# Patient Record
Sex: Female | Born: 1985 | Race: White | Hispanic: No | Marital: Single | Smoking: Never smoker
Health system: Southern US, Community
[De-identification: ages and names within clinical notes are randomized; demographics above are authoritative.]

## PROBLEM LIST (undated history)

## (undated) ENCOUNTER — Ambulatory Visit: Source: Home / Self Care

## (undated) DIAGNOSIS — Z982 Presence of cerebrospinal fluid drainage device: Secondary | ICD-10-CM

## (undated) HISTORY — PX: OTHER SURGICAL HISTORY: SHX169

## (undated) HISTORY — DX: Presence of cerebrospinal fluid drainage device: Z98.2

## (undated) HISTORY — PX: SHUNT REVISION VENTRICULAR-PERITONEAL: SHX6094

---

## 2007-08-31 ENCOUNTER — Emergency Department: Payer: Self-pay

## 2012-02-06 ENCOUNTER — Emergency Department (HOSPITAL_COMMUNITY)
Admission: EM | Admit: 2012-02-06 | Discharge: 2012-02-06 | Disposition: A | Discharge status: Home / Self Care | Payer: Managed Care, Other (non HMO) | Attending: Emergency Medicine | Admitting: Emergency Medicine

## 2012-02-06 ENCOUNTER — Encounter (HOSPITAL_COMMUNITY): Payer: Self-pay | Admitting: *Deleted

## 2012-02-06 ENCOUNTER — Emergency Department (HOSPITAL_COMMUNITY): Payer: Managed Care, Other (non HMO)

## 2012-02-06 DIAGNOSIS — F411 Generalized anxiety disorder: Secondary | ICD-10-CM | POA: Insufficient documentation

## 2012-02-06 DIAGNOSIS — IMO0002 Reserved for concepts with insufficient information to code with codable children: Secondary | ICD-10-CM | POA: Insufficient documentation

## 2012-02-06 DIAGNOSIS — N390 Urinary tract infection, site not specified: Secondary | ICD-10-CM

## 2012-02-06 DIAGNOSIS — F419 Anxiety disorder, unspecified: Secondary | ICD-10-CM

## 2012-02-06 DIAGNOSIS — R259 Unspecified abnormal involuntary movements: Secondary | ICD-10-CM | POA: Insufficient documentation

## 2012-02-06 DIAGNOSIS — Z79899 Other long term (current) drug therapy: Secondary | ICD-10-CM | POA: Insufficient documentation

## 2012-02-06 LAB — RAPID URINE DRUG SCREEN, HOSP PERFORMED
Amphetamines: NOT DETECTED
Cocaine: NOT DETECTED
Opiates: NOT DETECTED
Tetrahydrocannabinol: NOT DETECTED

## 2012-02-06 LAB — CBC WITH DIFFERENTIAL/PLATELET
Basophils Absolute: 0 10*3/uL (ref 0.0–0.1)
Eosinophils Relative: 1 % (ref 0–5)
Lymphocytes Relative: 36 % (ref 12–46)
MCV: 87.2 fL (ref 78.0–100.0)
Neutrophils Relative %: 56 % (ref 43–77)
Platelets: 350 10*3/uL (ref 150–400)
RDW: 12.6 % (ref 11.5–15.5)
WBC: 6.6 10*3/uL (ref 4.0–10.5)

## 2012-02-06 LAB — COMPREHENSIVE METABOLIC PANEL
ALT: 19 U/L (ref 0–35)
AST: 13 U/L (ref 0–37)
CO2: 24 mEq/L (ref 19–32)
Calcium: 9.7 mg/dL (ref 8.4–10.5)
GFR calc non Af Amer: >90 mL/min (ref >90)
Potassium: 3.8 mEq/L (ref 3.5–5.1)
Sodium: 140 mEq/L (ref 135–145)
Total Protein: 7.8 g/dL (ref 6.0–8.3)

## 2012-02-06 LAB — URINALYSIS, ROUTINE W REFLEX MICROSCOPIC
Bilirubin Urine: NEGATIVE
Specific Gravity, Urine: 1.009 (ref 1.005–1.030)
pH: 7.5 (ref 5.0–8.0)

## 2012-02-06 MED ORDER — METRONIDAZOLE 500 MG PO TABS: 500.0000 mg | ORAL_TABLET | Freq: Two times a day (BID) | ORAL | Status: DC

## 2012-02-06 MED ORDER — CEPHALEXIN 500 MG PO CAPS: 500.0000 mg | ORAL_CAPSULE | Freq: Four times a day (QID) | ORAL | Status: DC

## 2012-02-06 NOTE — ED Notes (Addendum)
Patient states she had an anxiety attack on Sunday, was seen at [facility].  Patient states she had another attack today while at work.  She states her body went numb and she started shaking.  She states she could not catch her breath.  Patient states she has numbness in her right hand and feels shaky all over.  Patient states she did not have any sx prior to event,  States she was not upset/anxious prior to event.  Patient states her sx lasts 5-10 min.  Patient denies pain.   She states she is having n/v today.  Patient does have vp shunt,  Last checked when she was 18.

## 2012-02-06 NOTE — ED Notes (Signed)
Pt and father are requesting a CT scan today.

## 2012-02-06 NOTE — ED Notes (Signed)
Pt had anxiety attack this am, second attack since last Sunday. Pt states body went numb, "unable to breathe for 5 mins", face turned red, right side of face became swollen. A&Ox4, ambulatory, nad. Pt had shunt placed in head when she was 54 mos old. Pt's father believes this may be pertinent to anxiety attacks.

## 2012-02-06 NOTE — ED Provider Notes (Signed)
History     CSN: [id number]  Arrival date & time 02/06/12  1043   First MD Initiated Contact with Patient 02/06/12 1116      Chief Complaint  Patient presents with  . Shaking  . Numbness    (Consider location/radiation/quality/duration/timing/severity/associated sxs/prior treatment) Patient is a 27 y.o. female presenting with anxiety. The history is provided by the patient (the pt states she had a shaking spell). No language interpreter was used.  Anxiety This is a recurrent problem. The current episode started less than 1 hour ago. The problem occurs constantly. The problem has been resolved. Pertinent negatives include no chest pain, no abdominal pain and no headaches. Nothing aggravates the symptoms. Nothing relieves the symptoms.    History reviewed. No pertinent past medical history.  Past Surgical History  Procedure Date  . Hydroceph   . Shunt revision ventricular-peritoneal     No family history on file.  History  Substance Use Topics  . Smoking status: Never Smoker   . Smokeless tobacco: Not on file  . Alcohol Use: Yes    OB History    Grav Para Term Preterm Abortions TAB SAB Ect Mult Living                  Review of Systems  Constitutional: Negative for fatigue.  HENT: Negative for congestion, sinus pressure and ear discharge.   Eyes: Negative for discharge.  Respiratory: Negative for cough.   Cardiovascular: Negative for chest pain.  Gastrointestinal: Negative for abdominal pain and diarrhea.  Genitourinary: Negative for frequency and hematuria.  Musculoskeletal: Negative for back pain.  Skin: Negative for rash.  Neurological: Negative for seizures and headaches.  Hematological: Negative.   Psychiatric/Behavioral: Positive for agitation. Negative for hallucinations.    Allergies  Review of patient's allergies indicates no known allergies.  Home Medications   Current Outpatient Rx  Name  Route  Sig  Dispense  Refill  . ALPRAZOLAM 0.5 MG PO  TABS   Oral   Take 0.5 mg by mouth every 8 (eight) hours.         . IBUPROFEN 200 MG PO TABS   Oral   Take 400 mg by mouth every 6 (six) hours as needed. For pain         . CEPHALEXIN 500 MG PO CAPS   Oral   Take 1 capsule (500 mg total) by mouth 4 (four) times daily.   28 capsule   0   . METRONIDAZOLE 500 MG PO TABS   Oral   Take 1 tablet (500 mg total) by mouth 2 (two) times daily. One po bid x 7 days   14 tablet   0     BP 119/101  Pulse 87  Temp 97.8 F (36.6 C) (Oral)  Resp 20  Ht 5\' 4"  (1.626 m)  Wt 160 lb (72.576 kg)  BMI 27.46 kg/m2  SpO2 99%  LMP 02/06/2012  Physical Exam  Constitutional: She is oriented to person, place, and time. She appears well-developed.  HENT:  Head: Normocephalic and atraumatic.  Eyes: Conjunctivae normal and EOM are normal. No scleral icterus.  Neck: Neck supple. No thyromegaly present.  Cardiovascular: Normal rate and regular rhythm.  Exam reveals no gallop and no friction rub.   No murmur heard. Pulmonary/Chest: No stridor. She has no wheezes. She has no rales. She exhibits no tenderness.  Abdominal: She exhibits no distension. There is no tenderness. There is no rebound.  Musculoskeletal: Normal range of motion. She exhibits  no edema.  Lymphadenopathy:    She has no cervical adenopathy.  Neurological: She is oriented to person, place, and time. Coordination normal.  Skin: No rash noted. No erythema.  Psychiatric: She has a normal mood and affect. Her behavior is normal.    ED Course  Procedures (including critical care time)  Labs Reviewed  URINALYSIS, ROUTINE W REFLEX MICROSCOPIC - Abnormal; Notable for the following:    Color, Urine RED (*)  BIOCHEMICALS MAY BE AFFECTED BY COLOR   APPearance CLOUDY (*)     Hgb urine dipstick LARGE (*)     Ketones, ur 15 (*)     Protein, ur 100 (*)     Leukocytes, UA MODERATE (*)     All other components within normal limits  URINE MICROSCOPIC-ADD ON - Abnormal; Notable for the  following:    Squamous Epithelial / LPF FEW (*)     Bacteria, UA MANY (*)     All other components within normal limits  CBC WITH DIFFERENTIAL  COMPREHENSIVE METABOLIC PANEL  PREGNANCY, URINE  URINE RAPID DRUG SCREEN (HOSP PERFORMED)  URINE CULTURE   Ct Head Wo Contrast  02/06/2012  *RADIOLOGY REPORT*  Clinical Data: Anxiety attack.  Shaking and numbness.  CT HEAD WITHOUT CONTRAST  Technique:  Contiguous axial images were obtained from the base of the skull through the vertex without contrast.  Comparison: None.  Findings: Hypodensity centrally in the pons on image 7 of series 2 is primarily attributed to streak artifact.  Cerebellum unremarkable.  The right parietal craniectomy noted with a right parietal approach ventriculoperitoneal shunt which terminates in the frontal horn the right lateral ventricle.  The occipital horn of the right lateral ventricle is dilated, with prominent loss of the right parietal white matter and thinning of the overlying cortex.  No intracranial hemorrhage, mass lesion, or acute infarction is identified.  No ventricular system dilatation noted.  Chronic ethmoid and right sphenoid sinusitis noted.  IMPRESSION:  1.  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  No obvious acute findings in this region. 2.  Chronic ethmoid and right sphenoid sinusitis.   Original Report Authenticated By: Gaylyn Rong, M.D.      1. Anxiety   2. UTI (lower urinary tract infection)       MDM          Benny Lennert, MD 02/06/12 1454

## 2012-02-07 LAB — URINE CULTURE: Colony Count: 15000

## 2016-06-27 ENCOUNTER — Encounter: Payer: Self-pay | Admitting: Advanced Practice Midwife

## 2016-07-25 ENCOUNTER — Ambulatory Visit (INDEPENDENT_AMBULATORY_CARE_PROVIDER_SITE_OTHER): Payer: No Typology Code available for payment source | Admitting: Advanced Practice Midwife

## 2016-07-25 ENCOUNTER — Encounter: Payer: Self-pay | Admitting: Advanced Practice Midwife

## 2016-07-25 VITALS — BP 120/80 | HR 76 | Ht 65.0 in | Wt 149.0 lb

## 2016-07-25 DIAGNOSIS — Z01419 Encounter for gynecological examination (general) (routine) without abnormal findings: Secondary | ICD-10-CM

## 2016-07-25 NOTE — Progress Notes (Signed)
Patient ID: Vicki LaymanCasey E Gallo, female   DOB: 12/12/1985, 31 y.o.   MRN: [id number]     Gynecology Annual Exam  PCP: Ailene RavelHamrick, Maura L, MD  Chief Complaint:  Chief Complaint  Patient presents with  . Gynecologic Exam    History of Present Illness: Patient is a 31 y.o. G0P0000 presents for annual exam. The patient has no complaints today. She has some pre-conception/fertility questions. She is not using any form of birth control. She and her fiance are okay with "if it happens it happens".   LMP: Patient's last menstrual period was 07/15/2016. Average Interval: regular, 28 days Duration of flow: 7 days Heavy Menses: no Clots: no Intermenstrual Bleeding: no Postcoital Bleeding: no Dysmenorrhea: no  The patient is sexually active. She currently uses none for contraception. She denies dyspareunia.  The patient does not perform self breast exams.  There is no notable family history of breast or ovarian cancer in her family.  The patient wears seatbelts: yes.   The patient has regular exercise: yes.  She admits to needing to improve healthy diet and h2o intake  The patient denies current symptoms of depression.    Review of Systems: Review of Systems  Constitutional: Negative.   HENT: Negative.   Eyes: Negative.   Respiratory: Negative.   Cardiovascular: Negative.   Gastrointestinal: Negative.   Genitourinary: Negative.   Musculoskeletal: Negative.   Skin: Negative.   Neurological: Negative.   Endo/Heme/Allergies: Negative.   Psychiatric/Behavioral: Negative.     Past Medical History:  Past Medical History:  Diagnosis Date  . VP (ventriculoperitoneal) shunt status     Past Surgical History:  Past Surgical History:  Procedure Laterality Date  . hydroceph    . SHUNT REVISION VENTRICULAR-PERITONEAL      Gynecologic History:  Patient's last menstrual period was 07/15/2016. Contraception: none Last Pap: Results were: no abnormalities   Obstetric History:  G0P0000  Family History:  No family history on file.  Social History:  Social History   Social History  . Marital status: Single    Spouse name: N/A  . Number of children: N/A  . Years of education: N/A   Occupational History  . Not on file.   Social History Main Topics  . Smoking status: Never Smoker  . Smokeless tobacco: Never Used  . Alcohol use Yes  . Drug use: No  . Sexual activity: Yes    Birth control/ protection: None   Other Topics Concern  . Not on file   Social History Narrative  . No narrative on file    Allergies:  No Known Allergies  Medications: Prior to Admission medications   Medication Sig Start Date End Date Taking? Authorizing Provider  ALPRAZolam Prudy Feeler(XANAX) 0.5 MG tablet Take 0.5 mg by mouth every 8 (eight) hours.    [provider]  cephALEXin (KEFLEX) 500 MG capsule Take 1 capsule (500 mg total) by mouth 4 (four) times daily. Patient not taking: Reported on 07/25/2016 02/06/12   Bethann BerkshireZammit, Joseph, MD  ibuprofen (ADVIL,MOTRIN) 200 MG tablet Take 400 mg by mouth every 6 (six) hours as needed. For pain    [provider]  metroNIDAZOLE (FLAGYL) 500 MG tablet Take 1 tablet (500 mg total) by mouth 2 (two) times daily. One po bid x 7 days Patient not taking: Reported on 07/25/2016 02/06/12   Bethann BerkshireZammit, Joseph, MD    Physical Exam Vitals: Blood pressure 120/80, pulse 76, height 5\' 5"  (1.651 m), weight 149 lb (67.6 kg), last menstrual period  07/15/2016.  General: NAD HEENT: normocephalic, anicteric Thyroid: no enlargement, no palpable nodules Pulmonary: No increased work of breathing, CTAB Cardiovascular: RRR, distal pulses 2+ Breast: Breast symmetrical, no tenderness, no palpable nodules or masses, no skin or nipple retraction present, no nipple discharge.  No axillary or supraclavicular lymphadenopathy. Abdomen: NABS, soft, non-tender, non-distended.  Umbilicus without lesions.  No hepatomegaly, splenomegaly or masses palpable. No evidence  of hernia  Genitourinary: Deferred for no PAP this year per guidelines and no gyn concerns Extremities: no edema, erythema, or tenderness Neurologic: Grossly intact Psychiatric: mood appropriate, affect full   Assessment: 31 y.o. G0P0000 Well woman exam  Plan: Problem List Items Addressed This Visit    None    Visit Diagnoses    Well woman exam with routine gynecological exam    -  Primary      1) STI screening was offered and declined  2) ASCCP guidelines and rational discussed.  Patient opts for every 3 years screening interval  3) Contraception - Patient chooses no form of birth control currently. She and her fiance are okay with pregnancy if it happens. Pre-conception counseling given.  4) Routine healthcare maintenance including cholesterol, diabetes screening discussed managed by PCP   5) Increase healthy lifestyle diet and hydration  6) Follow up 1 year for routine annual exam   [name], CNM

## 2017-05-14 ENCOUNTER — Encounter: Payer: Self-pay | Admitting: Advanced Practice Midwife

## 2017-05-14 ENCOUNTER — Ambulatory Visit (INDEPENDENT_AMBULATORY_CARE_PROVIDER_SITE_OTHER): Payer: No Typology Code available for payment source | Admitting: Advanced Practice Midwife

## 2017-05-14 VITALS — BP 130/82 | HR 93 | Ht 64.0 in | Wt 188.0 lb

## 2017-05-14 DIAGNOSIS — Z124 Encounter for screening for malignant neoplasm of cervix: Secondary | ICD-10-CM | POA: Diagnosis not present

## 2017-05-14 DIAGNOSIS — Z01419 Encounter for gynecological examination (general) (routine) without abnormal findings: Secondary | ICD-10-CM | POA: Diagnosis not present

## 2017-05-14 NOTE — Progress Notes (Addendum)
Patient ID: Vicki Mcdowell, female   DOB: 06/27/1985, 32 y.o.   MRN: [id number]      Gynecology Annual Exam   PCP: Ailene RavelHamrick, Maura L, MD  Chief Complaint:  Chief Complaint  Patient presents with  . Gynecologic Exam    History of Present Illness: Patient is a 32 y.o. G0P0000 presents for annual exam. The patient has no complaints today.   LMP: Patient's last menstrual period was 05/08/2017 (exact date). Average Interval: regular, 28 days Duration of flow: 7 days Heavy Menses: no Clots: no Intermenstrual Bleeding: no Postcoital Bleeding: no Dysmenorrhea: no  The patient is sexually active. She currently uses none for contraception. She denies dyspareunia.  The patient does perform self breast exams.  There is no notable family history of breast or ovarian cancer in her family.  The patient wears seatbelts: yes.   The patient has regular exercise: she is active at her job and walks regularly.    The patient denies current symptoms of depression.    Review of Systems: Review of Systems  Constitutional: Negative.   HENT: Negative.   Eyes: Negative.   Respiratory: Negative.   Cardiovascular: Negative.   Gastrointestinal: Negative.   Genitourinary: Negative.   Musculoskeletal: Negative.   Skin: Negative.   Neurological: Negative.   Endo/Heme/Allergies: Negative.   Psychiatric/Behavioral: Negative.     Past Medical History:  Past Medical History:  Diagnosis Date  . VP (ventriculoperitoneal) shunt status     Past Surgical History:  Past Surgical History:  Procedure Laterality Date  . hydroceph    . SHUNT REVISION VENTRICULAR-PERITONEAL      Gynecologic History:  Patient's last menstrual period was 05/08/2017 (exact date). Contraception: none Last Pap: Results were: no abnormalities   Obstetric History: G0P0000  Family History:  History reviewed. No pertinent family history.  Social History:  Social History   Socioeconomic History  . Marital status:  Single    Spouse name: Not on file  . Number of children: Not on file  . Years of education: Not on file  . Highest education level: Not on file  Occupational History  . Not on file  Social Needs  . Financial resource strain: Not on file  . Food insecurity:    Worry: Not on file    Inability: Not on file  . Transportation needs:    Medical: Not on file    Non-medical: Not on file  Tobacco Use  . Smoking status: Never Smoker  . Smokeless tobacco: Never Used  Substance and Sexual Activity  . Alcohol use: Never    Frequency: Never  . Drug use: No  . Sexual activity: Yes    Birth control/protection: None  Lifestyle  . Physical activity:    Days per week: 0 days    Minutes per session: 0 min  . Stress: Not at all  Relationships  . Social connections:    Talks on phone: Not on file    Gets together: Not on file    Attends religious service: Not on file    Active member of club or organization: Not on file    Attends meetings of clubs or organizations: Not on file    Relationship status: Not on file  . Intimate partner violence:    Fear of current or ex partner: Not on file    Emotionally abused: Not on file    Physically abused: Not on file    Forced sexual activity: Not on file  Other Topics Concern  .  Not on file  Social History Narrative  . Not on file    Allergies:  No Known Allergies  Medications: Prior to Admission medications   Not on File    Physical Exam Vitals: Blood pressure 130/82, pulse 93, height 5\' 4"  (1.626 m), weight 188 lb (85.3 kg), last menstrual period 05/08/2017.  General: NAD HEENT: normocephalic, anicteric Thyroid: no enlargement, no palpable nodules Pulmonary: No increased work of breathing, CTAB Cardiovascular: RRR, distal pulses 2+ Breast: Breast symmetrical, no tenderness, no palpable nodules or masses, no skin or nipple retraction present, no nipple discharge.  No axillary or supraclavicular lymphadenopathy. Abdomen: NABS, soft,  non-tender, non-distended.  Umbilicus without lesions.  No hepatomegaly, splenomegaly or masses palpable. No evidence of hernia  Genitourinary:  External: Normal external female genitalia.  Normal urethral meatus, normal Bartholin's and Skene's glands.    Vagina: Normal vaginal mucosa, no evidence of prolapse.    Cervix: Grossly normal in appearance, bleeding- end of period, no CMT  Uterus: deferred for no symptoms/shared decision making   Adnexa: deferred for no symptoms/shared decision making  Rectal: deferred  Lymphatic: no evidence of inguinal lymphadenopathy Extremities: no edema, erythema, or tenderness Neurologic: Grossly intact Psychiatric: mood appropriate, affect full   Assessment: 32 y.o. G0P0000 routine annual exam  Plan: Problem List Items Addressed This Visit    None    Visit Diagnoses    Well woman exam with routine gynecological exam    -  Primary   Relevant Orders   IGP, Aptima HPV   Cervical cancer screening       Relevant Orders   IGP, Aptima HPV      2) STI screening  wasoffered and declined  2)  ASCCP guidelines and rational discussed.  Patient opts for every 3-5 years screening interval  3) Contraception - the patient is currently using  none.  She is not actively trying to conceive but ok if it happens  4) Routine healthcare maintenance including cholesterol, diabetes screening discussed Declines  5) Return in 1 year (on 05/15/2018) for annual established gyn.   [name], CNM [facility], [facility] 05/14/2017, 3:58 PM

## 2017-05-14 NOTE — Patient Instructions (Signed)
American Heart Association (AHA) Exercise Recommendation  Being physically active is important to prevent heart disease and stroke, the nation's No. 1and No. 5killers. To improve overall cardiovascular health, we suggest at least 150 minutes per week of moderate exercise or 75 minutes per week of vigorous exercise (or a combination of moderate and vigorous activity). Thirty minutes a day, five times a week is an easy goal to remember. You will also experience benefits even if you divide your time into two or three segments of 10 to 15 minutes per day.  For people who would benefit from lowering their blood pressure or cholesterol, we recommend 40 minutes of aerobic exercise of moderate to vigorous intensity three to four times a week to lower the risk for heart attack and stroke.  Physical activity is anything that makes you move your body and burn calories.  This includes things like climbing stairs or playing sports. Aerobic exercises benefit your heart, and include walking, jogging, swimming or biking. Strength and stretching exercises are best for overall stamina and flexibility.  The simplest, positive change you can make to effectively improve your heart health is to start walking. It's enjoyable, free, easy, social and great exercise. A walking program is flexible and boasts high success rates because people can stick with it. It's easy for walking to become a regular and satisfying part of life.   For Overall Cardiovascular Health:  At least 30 minutes of moderate-intensity aerobic activity at least 5 days per week for a total of 150  OR   At least 25 minutes of vigorous aerobic activity at least 3 days per week for a total of 75 minutes; or a combination of moderate- and vigorous-intensity aerobic activity  AND   Moderate- to high-intensity muscle-strengthening activity at least 2 days per week for additional health benefits.  For Lowering Blood Pressure and Cholesterol  An  average 40 minutes of moderate- to vigorous-intensity aerobic activity 3 or 4 times per week  What if I can't make it to the time goal? Something is always better than nothing! And everyone has to start somewhere. Even if you've been sedentary for years, today is the day you can begin to make healthy changes in your life. If you don't think you'll make it for 30 or 40 minutes, set a reachable goal for today. You can work up toward your overall goal by increasing your time as you get stronger. Don't let all-or-nothing thinking rob you of doing what you can every day.  Source:http://www.heart.org   Mediterranean Diet A Mediterranean diet refers to food and lifestyle choices that are based on the traditions of countries located on the [employer]. This way of eating has been shown to help prevent certain conditions and improve outcomes for people who have chronic diseases, like kidney disease and heart disease. What are tips for following this plan? Lifestyle  Cook and eat meals together with your family, when possible.  Drink enough fluid to keep your urine clear or pale yellow.  Be physically active every day. This includes: ? Aerobic exercise like running or swimming. ? Leisure activities like gardening, walking, or housework.  Get 7-8 hours of sleep each night.  If recommended by your health care provider, drink red wine in moderation. This means 1 glass a day for nonpregnant women and 2 glasses a day for men. A glass of wine equals 5 oz (150 mL). Reading food labels  Check the serving size of packaged foods. For foods such as rice  and pasta, the serving size refers to the amount of cooked product, not dry.  Check the total fat in packaged foods. Avoid foods that have saturated fat or trans fats.  Check the ingredients list for added sugars, such as corn syrup. Shopping  At the grocery store, buy most of your food from the areas near the walls of the store. This  includes: ? Fresh fruits and vegetables (produce). ? Grains, beans, nuts, and seeds. Some of these may be available in unpackaged forms or large amounts (in bulk). ? Fresh seafood. ? Poultry and eggs. ? Low-fat dairy products.  Buy whole ingredients instead of prepackaged foods.  Buy fresh fruits and vegetables in-season from local farmers markets.  Buy frozen fruits and vegetables in resealable bags.  If you do not have access to quality fresh seafood, buy precooked frozen shrimp or canned fish, such as tuna, salmon, or sardines.  Buy small amounts of raw or cooked vegetables, salads, or olives from the deli or salad bar at your store.  Stock your pantry so you always have certain foods on hand, such as olive oil, canned tuna, canned tomatoes, rice, pasta, and beans. Cooking  Cook foods with extra-virgin olive oil instead of using butter or other vegetable oils.  Have meat as a side dish, and have vegetables or grains as your main dish. This means having meat in small portions or adding small amounts of meat to foods like pasta or stew.  Use beans or vegetables instead of meat in common dishes like chili or lasagna.  Experiment with different cooking methods. Try roasting or broiling vegetables instead of steaming or sauteing them.  Add frozen vegetables to soups, stews, pasta, or rice.  Add nuts or seeds for added healthy fat at each meal. You can add these to yogurt, salads, or vegetable dishes.  Marinate fish or vegetables using olive oil, lemon juice, garlic, and fresh herbs. Meal planning  Plan to eat 1 vegetarian meal one day each week. Try to work up to 2 vegetarian meals, if possible.  Eat seafood 2 or more times a week.  Have healthy snacks readily available, such as: ? Vegetable sticks with hummus. ? [place] yogurt. ? Fruit and nut trail mix.  Eat balanced meals throughout the week. This includes: ? Fruit: 2-3 servings a day ? Vegetables: 4-5 servings a  day ? Low-fat dairy: 2 servings a day ? Fish, poultry, or lean meat: 1 serving a day ? Beans and legumes: 2 or more servings a week ? Nuts and seeds: 1-2 servings a day ? Whole grains: 6-8 servings a day ? Extra-virgin olive oil: 3-4 servings a day  Limit red meat and sweets to only a few servings a month What are my food choices?  Mediterranean diet ? Recommended ? Grains: Whole-grain pasta. Brown rice. Bulgar wheat. Polenta. Couscous. Whole-wheat bread. [place]. ? Vegetables: Artichokes. Beets. Broccoli. Cabbage. Carrots. Eggplant. Green beans. Chard. Kale. Spinach. Onions. Leeks. Peas. Squash. Tomatoes. Peppers. Radishes. ? Fruits: Apples. Apricots. Avocado. Berries. Bananas. Cherries. Dates. Figs. Grapes. Lemons. Melon. Oranges. Peaches. Plums. Pomegranate. ? Meats and other protein foods: Beans. Almonds. Sunflower seeds. Pine nuts. Peanuts. [place]. Salmon. Scallops. Shrimp. [place]. Tilapia. Clams. Oysters. Eggs. ? Dairy: Low-fat milk. Cheese. Greek yogurt. ? Beverages: Water. Red wine. Herbal tea. ? Fats and oils: Extra virgin olive oil. Avocado oil. Grape seed oil. ? Sweets and desserts: [place] yogurt with honey. Baked apples. Poached pears. Trail mix. ? Seasoning and other foods: Basil. Cilantro. Coriander.  Cumin. Mint. Parsley. Sage. Rosemary. Tarragon. Garlic. Oregano. Thyme. Pepper. Balsalmic vinegar. Tahini. Hummus. Tomato sauce. Olives. Mushrooms. ? Limit these ? Grains: Prepackaged pasta or rice dishes. Prepackaged cereal with added sugar. ? Vegetables: Deep fried potatoes (french fries). ? Fruits: Fruit canned in syrup. ? Meats and other protein foods: Beef. Pork. Lamb. Poultry with skin. Hot dogs. Bacon. ? Dairy: Ice cream. Sour cream. Whole milk. ? Beverages: Juice. Sugar-sweetened soft drinks. Beer. Liquor and spirits. ? Fats and oils: Butter. Canola oil. Vegetable oil. Beef fat (tallow). Lard. ? Sweets and desserts: Cookies. Cakes. Pies. Candy. ? Seasoning and other  foods: Mayonnaise. Premade sauces and marinades. ? The items listed may not be a complete list. Talk with your dietitian about what dietary choices are right for you. Summary  The Mediterranean diet includes both food and lifestyle choices.  Eat a variety of fresh fruits and vegetables, beans, nuts, seeds, and whole grains.  Limit the amount of red meat and sweets that you eat.  Talk with your health care provider about whether it is safe for you to drink red wine in moderation. This means 1 glass a day for nonpregnant women and 2 glasses a day for men. A glass of wine equals 5 oz (150 mL). This information is not intended to replace advice given to you by your health care provider. Make sure you discuss any questions you have with your health care provider. Document Released: 09/06/2015 Document Revised: 10/09/2015 Document Reviewed: 09/06/2015 Elsevier Interactive Patient Education  2018 [employer] Health Maintenance, Female Adopting a healthy lifestyle and getting preventive care can go a long way to promote health and wellness. Talk with your health care provider about what schedule of regular examinations is right for you. This is a good chance for you to check in with your provider about disease prevention and staying healthy. In between checkups, there are plenty of things you can do on your own. Experts have done a lot of research about which lifestyle changes and preventive measures are most likely to keep you healthy. Ask your health care provider for more information. Weight and diet Eat a healthy diet  Be sure to include plenty of vegetables, fruits, low-fat dairy products, and lean protein.  Do not eat a lot of foods high in solid fats, added sugars, or salt.  Get regular exercise. This is one of the most important things you can do for your health. ? Most adults should exercise for at least 150 minutes each week. The exercise should increase your heart rate and make you  sweat (moderate-intensity exercise). ? Most adults should also do strengthening exercises at least twice a week. This is in addition to the moderate-intensity exercise.  Maintain a healthy weight  Body mass index (BMI) is a measurement that can be used to identify possible weight problems. It estimates body fat based on height and weight. Your health care provider can help determine your BMI and help you achieve or maintain a healthy weight.  For females 20 years of age and older: ? A BMI below 18.5 is considered underweight. ? A BMI of 18.5 to 24.9 is normal. ? A BMI of 25 to 29.9 is considered overweight. ? A BMI of 30 and above is considered obese.  Watch levels of cholesterol and blood lipids  You should start having your blood tested for lipids and cholesterol at 32 years of age, then have this test every 5 years.  You may need to have your cholesterol levels   checked more often if: ? Your lipid or cholesterol levels are high. ? You are older than 32 years of age. ? You are at high risk for heart disease.  Cancer screening Lung Cancer  Lung cancer screening is recommended for adults 34-81 years old who are at high risk for lung cancer because of a history of smoking.  A yearly low-dose CT scan of the lungs is recommended for people who: ? Currently smoke. ? Have quit within the past 15 years. ? Have at least a 30-pack-year history of smoking. A pack year is smoking an average of one pack of cigarettes a day for 1 year.  Yearly screening should continue until it has been 15 years since you quit.  Yearly screening should stop if you develop a health problem that would prevent you from having lung cancer treatment.  Breast Cancer  Practice breast self-awareness. This means understanding how your breasts normally appear and feel.  It also means doing regular breast self-exams. Let your health care provider know about any changes, no matter how small.  If you are in your 20s  or 30s, you should have a clinical breast exam (CBE) by a health care provider every 1-3 years as part of a regular health exam.  If you are 62 or older, have a CBE every year. Also consider having a breast X-ray (mammogram) every year.  If you have a family history of breast cancer, talk to your health care provider about genetic screening.  If you are at high risk for breast cancer, talk to your health care provider about having an MRI and a mammogram every year.  Breast cancer gene (BRCA) assessment is recommended for women who have family members with BRCA-related cancers. BRCA-related cancers include: ? Breast. ? Ovarian. ? Tubal. ? Peritoneal cancers.  Results of the assessment will determine the need for genetic counseling and BRCA1 and BRCA2 testing.  Cervical Cancer Your health care provider may recommend that you be screened regularly for cancer of the pelvic organs (ovaries, uterus, and vagina). This screening involves a pelvic examination, including checking for microscopic changes to the surface of your cervix (Pap test). You may be encouraged to have this screening done every 3 years, beginning at age 8.  For women ages 52-65, health care providers may recommend pelvic exams and Pap testing every 3 years, or they may recommend the Pap and pelvic exam, combined with testing for human papilloma virus (HPV), every 5 years. Some types of HPV increase your risk of cervical cancer. Testing for HPV may also be done on women of any age with unclear Pap test results.  Other health care providers may not recommend any screening for nonpregnant women who are considered low risk for pelvic cancer and who do not have symptoms. Ask your health care provider if a screening pelvic exam is right for you.  If you have had past treatment for cervical cancer or a condition that could lead to cancer, you need Pap tests and screening for cancer for at least 20 years after your treatment. If Pap tests  have been discontinued, your risk factors (such as having a new sexual partner) need to be reassessed to determine if screening should resume. Some women have medical problems that increase the chance of getting cervical cancer. In these cases, your health care provider may recommend more frequent screening and Pap tests.  Colorectal Cancer  This type of cancer can be detected and often prevented.  Routine colorectal cancer screening  usually begins at 32 years of age and continues through 32 years of age.  Your health care provider may recommend screening at an earlier age if you have risk factors for colon cancer.  Your health care provider may also recommend using home test kits to check for hidden blood in the stool.  A small camera at the end of a tube can be used to examine your colon directly (sigmoidoscopy or colonoscopy). This is done to check for the earliest forms of colorectal cancer.  Routine screening usually begins at age 8.  Direct examination of the colon should be repeated every 5-10 years through 32 years of age. However, you may need to be screened more often if early forms of precancerous polyps or small growths are found.  Skin Cancer  Check your skin from head to toe regularly.  Tell your health care provider about any new moles or changes in moles, especially if there is a change in a mole's shape or color.  Also tell your health care provider if you have a mole that is larger than the size of a pencil eraser.  Always use sunscreen. Apply sunscreen liberally and repeatedly throughout the day.  Protect yourself by wearing long sleeves, pants, a wide-brimmed hat, and sunglasses whenever you are outside.  Heart disease, diabetes, and high blood pressure  High blood pressure causes heart disease and increases the risk of stroke. High blood pressure is more likely to develop in: ? People who have blood pressure in the high end of the normal range (130-139/85-89 mm  Hg). ? People who are overweight or obese. ? People who are African American.  If you are 29-23 years of age, have your blood pressure checked every 3-5 years. If you are 68 years of age or older, have your blood pressure checked every year. You should have your blood pressure measured twice-once when you are at a hospital or clinic, and once when you are not at a hospital or clinic. Record the average of the two measurements. To check your blood pressure when you are not at a hospital or clinic, you can use: ? An automated blood pressure machine at a pharmacy. ? A home blood pressure monitor.  If you are between 82 years and 71 years old, ask your health care provider if you should take aspirin to prevent strokes.  Have regular diabetes screenings. This involves taking a blood sample to check your fasting blood sugar level. ? If you are at a normal weight and have a low risk for diabetes, have this test once every three years after 32 years of age. ? If you are overweight and have a high risk for diabetes, consider being tested at a younger age or more often. Preventing infection Hepatitis B  If you have a higher risk for hepatitis B, you should be screened for this virus. You are considered at high risk for hepatitis B if: ? You were born in a country where hepatitis B is common. Ask your health care provider which countries are considered high risk. ? Your parents were born in a high-risk country, and you have not been immunized against hepatitis B (hepatitis B vaccine). ? You have HIV or AIDS. ? You use needles to inject street drugs. ? You live with someone who has hepatitis B. ? You have had sex with someone who has hepatitis B. ? You get hemodialysis treatment. ? You take certain medicines for conditions, including cancer, organ transplantation, and autoimmune conditions.  Hepatitis C  Blood testing is recommended for: ? Everyone born from [birth year] through 1965. ? Anyone with known  risk factors for hepatitis C.  Sexually transmitted infections (STIs)  You should be screened for sexually transmitted infections (STIs) including gonorrhea and chlamydia if: ? You are sexually active and are younger than 32 years of age. ? You are older than 32 years of age and your health care provider tells you that you are at risk for this type of infection. ? Your sexual activity has changed since you were last screened and you are at an increased risk for chlamydia or gonorrhea. Ask your health care provider if you are at risk.  If you do not have HIV, but are at risk, it may be recommended that you take a prescription medicine daily to prevent HIV infection. This is called pre-exposure prophylaxis (PrEP). You are considered at risk if: ? You are sexually active and do not regularly use condoms or know the HIV status of your partner(s). ? You take drugs by injection. ? You are sexually active with a partner who has HIV.  Talk with your health care provider about whether you are at high risk of being infected with HIV. If you choose to begin PrEP, you should first be tested for HIV. You should then be tested every 3 months for as long as you are taking PrEP. Pregnancy  If you are premenopausal and you may become pregnant, ask your health care provider about preconception counseling.  If you may become pregnant, take 400 to 800 micrograms (mcg) of folic acid every day.  If you want to prevent pregnancy, talk to your health care provider about birth control (contraception). Osteoporosis and menopause  Osteoporosis is a disease in which the bones lose minerals and strength with aging. This can result in serious bone fractures. Your risk for osteoporosis can be identified using a bone density scan.  If you are 74 years of age or older, or if you are at risk for osteoporosis and fractures, ask your health care provider if you should be screened.  Ask your health care provider whether you  should take a calcium or vitamin D supplement to lower your risk for osteoporosis.  Menopause may have certain physical symptoms and risks.  Hormone replacement therapy may reduce some of these symptoms and risks. Talk to your health care provider about whether hormone replacement therapy is right for you. Follow these instructions at home:  Schedule regular health, dental, and eye exams.  Stay current with your immunizations.  Do not use any tobacco products including cigarettes, chewing tobacco, or electronic cigarettes.  If you are pregnant, do not drink alcohol.  If you are breastfeeding, limit how much and how often you drink alcohol.  Limit alcohol intake to no more than 1 drink per day for nonpregnant women. One drink equals 12 ounces of beer, 5 ounces of wine, or 1 ounces of hard liquor.  Do not use street drugs.  Do not share needles.  Ask your health care provider for help if you need support or information about quitting drugs.  Tell your health care provider if you often feel depressed.  Tell your health care provider if you have ever been abused or do not feel safe at home. This information is not intended to replace advice given to you by your health care provider. Make sure you discuss any questions you have with your health care provider. Document Released: 07/29/2010 Document Revised: 06/21/2015 Document Reviewed:  10/17/2014 Elsevier Interactive Patient Education  [employer].

## 2017-05-18 LAB — IGP, APTIMA HPV
HPV Aptima: NEGATIVE
PAP SMEAR COMMENT: 0

## 2018-09-23 ENCOUNTER — Encounter: Payer: Self-pay | Admitting: Advanced Practice Midwife

## 2018-09-23 ENCOUNTER — Ambulatory Visit (INDEPENDENT_AMBULATORY_CARE_PROVIDER_SITE_OTHER): Payer: No Typology Code available for payment source | Admitting: Advanced Practice Midwife

## 2018-09-23 ENCOUNTER — Other Ambulatory Visit: Payer: Self-pay

## 2018-09-23 VITALS — BP 120/80 | Ht 63.0 in | Wt 193.0 lb

## 2018-09-23 DIAGNOSIS — Z0000 Encounter for general adult medical examination without abnormal findings: Secondary | ICD-10-CM

## 2018-09-23 DIAGNOSIS — Z01419 Encounter for gynecological examination (general) (routine) without abnormal findings: Secondary | ICD-10-CM

## 2018-09-23 NOTE — Patient Instructions (Signed)
Health Maintenance, Female Adopting a healthy lifestyle and getting preventive care are important in promoting health and wellness. Ask your health care provider about:  The right schedule for you to have regular tests and exams.  Things you can do on your own to prevent diseases and keep yourself healthy. What should I know about diet, weight, and exercise? Eat a healthy diet   Eat a diet that includes plenty of vegetables, fruits, low-fat dairy products, and lean protein.  Do not eat a lot of foods that are high in solid fats, added sugars, or sodium. Maintain a healthy weight Body mass index (BMI) is used to identify weight problems. It estimates body fat based on height and weight. Your health care provider can help determine your BMI and help you achieve or maintain a healthy weight. Get regular exercise Get regular exercise. This is one of the most important things you can do for your health. Most adults should:  Exercise for at least 150 minutes each week. The exercise should increase your heart rate and make you sweat (moderate-intensity exercise).  Do strengthening exercises at least twice a week. This is in addition to the moderate-intensity exercise.  Spend less time sitting. Even light physical activity can be beneficial. Watch cholesterol and blood lipids Have your blood tested for lipids and cholesterol at 33 years of age, then have this test every 5 years. Have your cholesterol levels checked more often if:  Your lipid or cholesterol levels are high.  You are older than 33 years of age.  You are at high risk for heart disease. What should I know about cancer screening? Depending on your health history and family history, you may need to have cancer screening at various ages. This may include screening for:  Breast cancer.  Cervical cancer.  Colorectal cancer.  Skin cancer.  Lung cancer. What should I know about heart disease, diabetes, and high blood  pressure? Blood pressure and heart disease  High blood pressure causes heart disease and increases the risk of stroke. This is more likely to develop in people who have high blood pressure readings, are of African descent, or are overweight.  Have your blood pressure checked: ? Every 3-5 years if you are 18-39 years of age. ? Every year if you are 40 years old or older. Diabetes Have regular diabetes screenings. This checks your fasting blood sugar level. Have the screening done:  Once every three years after age 40 if you are at a normal weight and have a low risk for diabetes.  More often and at a younger age if you are overweight or have a high risk for diabetes. What should I know about preventing infection? Hepatitis B If you have a higher risk for hepatitis B, you should be screened for this virus. Talk with your health care provider to find out if you are at risk for hepatitis B infection. Hepatitis C Testing is recommended for:  Everyone born from 1945 through 1965.  Anyone with known risk factors for hepatitis C. Sexually transmitted infections (STIs)  Get screened for STIs, including gonorrhea and chlamydia, if: ? You are sexually active and are younger than 33 years of age. ? You are older than 33 years of age and your health care provider tells you that you are at risk for this type of infection. ? Your sexual activity has changed since you were last screened, and you are at increased risk for chlamydia or gonorrhea. Ask your health care provider if   you are at risk.  Ask your health care provider about whether you are at high risk for HIV. Your health care provider may recommend a prescription medicine to help prevent HIV infection. If you choose to take medicine to prevent HIV, you should first get tested for HIV. You should then be tested every 3 months for as long as you are taking the medicine. Pregnancy  If you are about to stop having your period (premenopausal) and  you may become pregnant, seek counseling before you get pregnant.  Take 400 to 800 micrograms (mcg) of folic acid every day if you become pregnant.  Ask for birth control (contraception) if you want to prevent pregnancy. Osteoporosis and menopause Osteoporosis is a disease in which the bones lose minerals and strength with aging. This can result in bone fractures. If you are 65 years old or older, or if you are at risk for osteoporosis and fractures, ask your health care provider if you should:  Be screened for bone loss.  Take a calcium or vitamin D supplement to lower your risk of fractures.  Be given hormone replacement therapy (HRT) to treat symptoms of menopause. Follow these instructions at home: Lifestyle  Do not use any products that contain nicotine or tobacco, such as cigarettes, e-cigarettes, and chewing tobacco. If you need help quitting, ask your health care provider.  Do not use street drugs.  Do not share needles.  Ask your health care provider for help if you need support or information about quitting drugs. Alcohol use  Do not drink alcohol if: ? Your health care provider tells you not to drink. ? You are pregnant, may be pregnant, or are planning to become pregnant.  If you drink alcohol: ? Limit how much you use to 0-1 drink a day. ? Limit intake if you are breastfeeding.  Be aware of how much alcohol is in your drink. In the [place] one drink equals one 12 oz bottle of beer (355 mL), one 5 oz glass of wine (148 mL), or one 1 oz glass of hard liquor (44 mL). General instructions  Schedule regular health, dental, and eye exams.  Stay current with your vaccines.  Tell your health care provider if: ? You often feel depressed. ? You have ever been abused or do not feel safe at home. Summary  Adopting a healthy lifestyle and getting preventive care are important in promoting health and wellness.  Follow your health care provider's instructions about healthy  diet, exercising, and getting tested or screened for diseases.  Follow your health care provider's instructions on monitoring your cholesterol and blood pressure. This information is not intended to replace advice given to you by your health care provider. Make sure you discuss any questions you have with your health care provider. Document Released: 07/29/2010 Document Revised: 01/06/2018 Document Reviewed: 01/06/2018 Elsevier Patient Education  2020 Elsevier Inc.  

## 2018-09-23 NOTE — Progress Notes (Signed)
Gynecology Annual Exam   PCP: Leonides Sake, MD  Chief Complaint:  Chief Complaint  Patient presents with  . Gynecologic Exam    History of Present Illness: Patient is a 33 y.o. G0P0000 presents for annual exam. The patient has complaint today of actively trying to conceive since her last annual exam in April of 2019. She admits paying attention to likely ovulation times and taking basal body temperature. She has a regular monthly cycle every 28 days that lasts for 5-7 days and is of moderate flow.  She has no other complaints today.  Her partner has 3 children from a previous relationship. She denies any family history of infertility but she does mention that her sister has just recently had a hysterosalpingogram with normal results.  LMP: Patient's last menstrual period was 08/31/2018.  Clots: no Intermenstrual Bleeding: no Postcoital Bleeding: no Dysmenorrhea: no  The patient is sexually active. She currently uses none for contraception. She denies dyspareunia.  The patient does perform self breast exams.  There is no notable family history of breast or ovarian cancer in her family.  The patient wears seatbelts: yes.   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 She admits healthy diet. She admits adequate hydration and sleep.    The patient denies current symptoms of depression.  She denies anxiety but does admit some increase in stress level due to Covid. Discussed possible connection between stress and hormone function.  Review of Systems: Review of Systems  Constitutional: Negative.   HENT: Negative.   Eyes: Negative.   Respiratory: Negative.   Cardiovascular: Negative.   Gastrointestinal: Negative.   Genitourinary: Negative.   Musculoskeletal: Negative.   Skin: Negative.   Neurological: Negative.   Endo/Heme/Allergies: Negative.   Psychiatric/Behavioral: Negative.     Past Medical History:  Past Medical  History:  Diagnosis Date  . VP (ventriculoperitoneal) shunt status     Past Surgical History:  Past Surgical History:  Procedure Laterality Date  . hydroceph    . SHUNT REVISION VENTRICULAR-PERITONEAL      Gynecologic History:  Patient's last menstrual period was 08/31/2018. Contraception: none Last Pap: 1 year ago Results were: no abnormalities   Obstetric History: G0P0000  Family History:  History reviewed. No pertinent family history.  Social History:  Social History   Socioeconomic History  . Marital status: Single    Spouse name: Not on file  . Number of children: Not on file  . Years of education: Not on file  . Highest education level: Not on file  Occupational History  . Not on file  Social Needs  . Financial resource strain: Not on file  . Food insecurity    Worry: Not on file    Inability: Not on file  . Transportation needs    Medical: Not on file    Non-medical: Not on file  Tobacco Use  . Smoking status: Never Smoker  . Smokeless tobacco: Never Used  Substance and Sexual Activity  . Alcohol use: Never    Frequency: Never  . Drug use: No  . Sexual activity: Yes    Birth control/protection: None  Lifestyle  . Physical activity    Days per week: 0 days    Minutes per session: 0 min  . Stress: Not at all  Relationships  . Social Herbalist on phone: Not on file    Gets together: Not on file  Attends religious service: Not on file    Active member of club or organization: Not on file    Attends meetings of clubs or organizations: Not on file    Relationship status: Not on file  . Intimate partner violence    Fear of current or ex partner: Not on file    Emotionally abused: Not on file    Physically abused: Not on file    Forced sexual activity: Not on file  Other Topics Concern  . Not on file  Social History Narrative  . Not on file    Allergies:  No Known Allergies  Medications: Prior to Admission medications    Medication Sig Start Date End Date Taking? Authorizing Provider  terbinafine (LAMISIL) 250 MG tablet Take 250 mg by mouth daily. 06/15/18  Yes [provider]    Physical Exam Vitals: Blood pressure 120/80, height 5\' 3"  (1.6 m), weight 193 lb (87.5 kg), last menstrual period 08/31/2018.  General: NAD HEENT: normocephalic, anicteric Thyroid: no enlargement, no palpable nodules Pulmonary: No increased work of breathing, CTAB Cardiovascular: RRR, distal pulses 2+ Breast: Breast symmetrical, no tenderness, no palpable nodules or masses, no skin or nipple retraction present, no nipple discharge.  No axillary or supraclavicular lymphadenopathy. Abdomen: NABS, soft, non-tender, non-distended.  Umbilicus without lesions.  No hepatomegaly, splenomegaly or masses palpable. No evidence of hernia  Genitourinary: deferred for no concerns/PAP interval Extremities: no edema, erythema, or tenderness Neurologic: Grossly intact Psychiatric: mood appropriate, affect full   Assessment: 33 y.o. G0P0000 routine annual exam  Plan: Problem List Items Addressed This Visit    None    Visit Diagnoses    Well woman exam without gynecological exam    -  Primary      1) STI screening  was offered and declined  2)  ASCCP guidelines and rationale discussed.  Patient opts for every 3 years screening interval  3) Contraception - the patient is currently using  none.  She is attempting to conceive in the near future  4) Routine healthcare maintenance including cholesterol, diabetes screening discussed Declines  5) Return for follow up with MD regarding infertility consult.   Vicki Mcdowell, CNM [facility] 09/23/2018, 4:34 PM

## 2018-10-01 ENCOUNTER — Ambulatory Visit (INDEPENDENT_AMBULATORY_CARE_PROVIDER_SITE_OTHER): Payer: No Typology Code available for payment source | Admitting: Obstetrics and Gynecology

## 2018-10-01 ENCOUNTER — Other Ambulatory Visit: Payer: Self-pay

## 2018-10-01 ENCOUNTER — Encounter: Payer: Self-pay | Admitting: Obstetrics and Gynecology

## 2018-10-01 VITALS — BP 120/82 | HR 87 | Ht 64.0 in | Wt 194.0 lb

## 2018-10-01 DIAGNOSIS — N979 Female infertility, unspecified: Secondary | ICD-10-CM

## 2018-10-01 NOTE — Progress Notes (Signed)
Patient ID: Vicki Mcdowell, female   DOB: 1985-02-03, 33 y.o.   MRN: [id number]  Reason for Consult: Consult (Trying since August, starting to track ovulation )   Referred by [facility], Lorin Mercy, MD  Subjective:     HPI:  Vicki Mcdowell is a 33 y.o. female. She presents today for a consultation regarding trying to conceive. She reports trying to conceive since April of 2019.  She and her partner have intercourse about 1-2 times a week. They do not use lubricants or birth control.   Patient's partner has 3 other children through a different relationship.   Gynecological History Menarche: [age] Menopause: no LMP: 09/06/2018 Describes periods as regular, monthly with moderate blood loss lasting 5-7 days, no heavy bleeding. No pain Last pap smear: 2019 NIL History of STDs: Denies Sexually Active: Yes  Obstetrical History None  Past Medical History:  Diagnosis Date  . VP (ventriculoperitoneal) shunt status    History reviewed. No pertinent family history. Past Surgical History:  Procedure Laterality Date  . hydroceph    . SHUNT REVISION VENTRICULAR-PERITONEAL      Short Social History:  Social History   Tobacco Use  . Smoking status: Never Smoker  . Smokeless tobacco: Never Used  Substance Use Topics  . Alcohol use: Never    Frequency: Never    No Known Allergies  Current Outpatient Medications  Medication Sig Dispense Refill  . terbinafine (LAMISIL) 250 MG tablet Take 250 mg by mouth daily.     No current facility-administered medications for this visit.     Review of Systems  Constitutional: Negative for chills, fatigue, fever and unexpected weight change.  HENT: Negative for trouble swallowing.  Eyes: Negative for loss of vision.  Respiratory: Negative for cough, shortness of breath and wheezing.  Cardiovascular: Negative for chest pain, leg swelling, palpitations and syncope.  GI: Negative for abdominal pain, blood in stool, diarrhea, nausea and vomiting.   GU: Negative for difficulty urinating, dysuria, frequency and hematuria.  Musculoskeletal: Negative for back pain, leg pain and joint pain.  Skin: Negative for rash.  Neurological: Negative for dizziness, headaches, light-headedness, numbness and seizures.  Psychiatric: Negative for behavioral problem, confusion, depressed mood and sleep disturbance.        Objective:  Objective   Vitals:   10/01/18 1018  BP: 120/82  Pulse: 87  Weight: 194 lb (88 kg)  Height: 5\' 4"  (1.626 m)   Body mass index is 33.3 kg/m.  Physical Exam Vitals signs and nursing note reviewed.  Constitutional:      Appearance: She is well-developed.  HENT:     Head: Normocephalic and atraumatic.  Eyes:     Pupils: Pupils are equal, round, and reactive to light.  Cardiovascular:     Rate and Rhythm: Normal rate and regular rhythm.  Pulmonary:     Effort: Pulmonary effort is normal. No respiratory distress.  Skin:    General: Skin is warm and dry.  Neurological:     Mental Status: She is alert and oriented to person, place, and time.  Psychiatric:        Behavior: Behavior normal.        Thought Content: Thought content normal.        Judgment: Judgment normal.        Assessment/Plan:     33 yo here for consultation regarding infertility.  1. She will return for cycle day 2-5 labs.  2. Will call to schedule HSG to evaluate tubal patency  3. Given instructions regarding semen analysis for partner 4. Will follow up for US. Consider office hysteroscopy in the future. 5. She will do ovulation testing at home days 12-20 until positive to confirm ovulation.    More than 40 minutes were spent face to face with the patient in the room with more than 50% of the time spent providing counseling and discussing the plan of management. We discussed infertility evaluation and treatments. Recommended every other day intercourse during fertile window to help with conception.     Adelene Idlerhristanna Laronda Lisby MD [facility], [facility] 10/01/2018 11:04 AM

## 2018-10-06 ENCOUNTER — Telehealth: Payer: Self-pay | Admitting: Obstetrics and Gynecology

## 2018-10-06 NOTE — Telephone Encounter (Signed)
Lmtrc

## 2018-10-06 NOTE — Telephone Encounter (Signed)
Per patient, her LMP was 10/02/18 (on cycle now). HSG must be scheduled within first 10 days of cycle and on provider's OR day. Dr. Gennette Pac OR day has passed. Patient will call back on the first day of her next cycle for scheduling. Ext given.

## 2018-10-06 NOTE — Telephone Encounter (Signed)
-----   Message from Homero Fellers, MD sent at 10/01/2018 10:55 AM EDT ----- Please schedule this patient for a HSG Thank you,  Dr. Gilman Schmidt

## 2018-10-25 ENCOUNTER — Telehealth: Payer: Self-pay | Admitting: Obstetrics and Gynecology

## 2018-10-25 NOTE — Telephone Encounter (Signed)
Patient schedule for HSG at [facility] on Thursday 10/28/18 at 1:30 arrival is 1:15 for registration. Ultrasound appointment for [facility] is reschedule to Tuesday, 11/02/18 at 2:30. Left voicemail to call back to confirm appointments schedule

## 2018-10-26 NOTE — Telephone Encounter (Signed)
Scheduled for 10.1.20

## 2018-10-28 ENCOUNTER — Other Ambulatory Visit: Payer: No Typology Code available for payment source

## 2018-10-28 ENCOUNTER — Ambulatory Visit: Payer: No Typology Code available for payment source | Admitting: Obstetrics and Gynecology

## 2018-10-28 ENCOUNTER — Other Ambulatory Visit: Payer: Self-pay

## 2018-10-28 ENCOUNTER — Ambulatory Visit
Admission: RE | Admit: 2018-10-28 | Discharge: 2018-10-28 | Disposition: A | Discharge status: Home / Self Care | Payer: PRIVATE HEALTH INSURANCE | Source: Ambulatory Visit | Attending: Obstetrics and Gynecology | Admitting: Obstetrics and Gynecology

## 2018-10-28 DIAGNOSIS — Z319 Encounter for procreative management, unspecified: Secondary | ICD-10-CM | POA: Diagnosis not present

## 2018-10-28 DIAGNOSIS — N979 Female infertility, unspecified: Secondary | ICD-10-CM

## 2018-10-28 HISTORY — PX: DG HYSTEROGRAM (HSG): IMG2510

## 2018-10-28 MED ORDER — IOPAMIDOL (ISOVUE-300) INJECTION 61%
15.0000 mL | Freq: Once | INTRAVENOUS | Status: AC | PRN
  Administered 2018-10-28: 15 mL

## 2018-10-28 NOTE — Procedures (Signed)
Prep and drape done.  Speculum used to visualize the cervix which was grossly  normal.  Tenaculum placed.  Catheter placed into uterine cavity. Approximately 15 mL dye injected under fluoroscopic visualization.  Patent bilateral fallopian tube seen. Slight arcuate shape of the uterine cavity.   Catheter and tenaculum removed.  Pt stable, tolerated procedure well.

## 2018-11-02 ENCOUNTER — Encounter: Payer: Self-pay | Admitting: Obstetrics and Gynecology

## 2018-11-02 ENCOUNTER — Ambulatory Visit (INDEPENDENT_AMBULATORY_CARE_PROVIDER_SITE_OTHER): Payer: No Typology Code available for payment source | Admitting: Obstetrics and Gynecology

## 2018-11-02 ENCOUNTER — Ambulatory Visit (INDEPENDENT_AMBULATORY_CARE_PROVIDER_SITE_OTHER): Payer: No Typology Code available for payment source

## 2018-11-02 ENCOUNTER — Other Ambulatory Visit: Payer: Self-pay

## 2018-11-02 VITALS — BP 130/78 | HR 85 | Ht 64.0 in | Wt 194.0 lb

## 2018-11-02 DIAGNOSIS — N839 Noninflammatory disorder of ovary, fallopian tube and broad ligament, unspecified: Secondary | ICD-10-CM | POA: Diagnosis not present

## 2018-11-02 DIAGNOSIS — N979 Female infertility, unspecified: Secondary | ICD-10-CM

## 2018-11-02 NOTE — Patient Instructions (Signed)
Labs on cycle day 18-24 [ ]  AMH [ ]  TSH [ ]  Prolactin [ ]  Progesterone   Labs cycle day 2-5 [ ]  LH/FSH [ ]   Estradiol  General [ ]  Semen analysis [ ]  Hysteroscopy

## 2018-11-02 NOTE — Progress Notes (Signed)
Patient ID: Vicki Mcdowell, female   DOB: March 28, 1985, 33 y.o.   MRN: [id number]  Reason for Consult: Follow-up (GYN U/S follow up )   Referred by [facility], Lorin Mercy, MD  Subjective:     HPI:  Vicki Mcdowell is a 33 y.o. female. She presents today for follow up of infertility care. She has a HSG last week which showed normal fallopian tubes. It also showed that her uterus has an arcuate appearance. Discussed that this is generally considered a variation of normal and not known to impact fertility of future pregnancies.  Recent pelvic US normal as well.  Patient not able to find ovulation test kits over the counter. She is not comfortable ordering them online.  Her partner has not yet scheduled the semen analysis. He works Architect and is very busy. He is often out of town.   Past Medical History:  Diagnosis Date  . VP (ventriculoperitoneal) shunt status    History reviewed. No pertinent family history. Past Surgical History:  Procedure Laterality Date  . DG HYSTEROGRAM (HSG)  10/28/2018      . hydroceph    . SHUNT REVISION VENTRICULAR-PERITONEAL      Short Social History:  Social History   Tobacco Use  . Smoking status: Never Smoker  . Smokeless tobacco: Never Used  Substance Use Topics  . Alcohol use: Never    Frequency: Never    No Known Allergies  Current Outpatient Medications  Medication Sig Dispense Refill  . terbinafine (LAMISIL) 250 MG tablet Take 250 mg by mouth daily.     No current facility-administered medications for this visit.     Review of Systems  Constitutional: Negative for chills, fatigue, fever and unexpected weight change.  HENT: Negative for trouble swallowing.  Eyes: Negative for loss of vision.  Respiratory: Negative for cough, shortness of breath and wheezing.  Cardiovascular: Negative for chest pain, leg swelling, palpitations and syncope.  GI: Negative for abdominal pain, blood in stool, diarrhea, nausea and vomiting.  GU: Negative  for difficulty urinating, dysuria, frequency and hematuria.  Musculoskeletal: Negative for back pain, leg pain and joint pain.  Skin: Negative for rash.  Neurological: Negative for dizziness, headaches, light-headedness, numbness and seizures.  Psychiatric: Negative for behavioral problem, confusion, depressed mood and sleep disturbance.        Objective:  Objective   Vitals:   11/02/18 1450  BP: 130/78  Pulse: 85  Weight: 194 lb (88 kg)  Height: 5\' 4"  (1.626 m)   Body mass index is 33.3 kg/m.  Physical Exam Vitals signs and nursing note reviewed.  Constitutional:      Appearance: She is well-developed.  HENT:     Head: Normocephalic and atraumatic.  Eyes:     Pupils: Pupils are equal, round, and reactive to light.  Cardiovascular:     Rate and Rhythm: Normal rate and regular rhythm.  Pulmonary:     Effort: Pulmonary effort is normal. No respiratory distress.  Skin:    General: Skin is warm and dry.  Neurological:     Mental Status: She is alert and oriented to person, place, and time.  Psychiatric:        Behavior: Behavior normal.        Thought Content: Thought content normal.        Judgment: Judgment normal.         Assessment/Plan:     33 yo with infertility She will return for lab work cycle day 18-24 to  access for ovulation.  She will also return for cycle day 2-5 labs Given check list of remaining evaluation components.  Follow up in about 1 month to review results and discuss next steps.  She will call to schedule hysteroscopy if she desires.   More than 15 minutes were spent face to face with the patient in the room with more than 50% of the time spent providing counseling and discussing the plan of management.   Adelene Idler MD [facility], [facility] 11/02/2018 3:26 PM

## 2018-11-15 ENCOUNTER — Other Ambulatory Visit: Payer: No Typology Code available for payment source

## 2018-11-19 ENCOUNTER — Other Ambulatory Visit: Payer: Self-pay

## 2018-11-19 ENCOUNTER — Other Ambulatory Visit: Payer: No Typology Code available for payment source

## 2018-11-19 DIAGNOSIS — N839 Noninflammatory disorder of ovary, fallopian tube and broad ligament, unspecified: Secondary | ICD-10-CM

## 2018-11-20 LAB — PROGESTERONE: Progesterone: 0.1 ng/mL

## 2018-11-22 ENCOUNTER — Other Ambulatory Visit: Payer: Self-pay | Admitting: Obstetrics and Gynecology

## 2018-11-22 DIAGNOSIS — N839 Noninflammatory disorder of ovary, fallopian tube and broad ligament, unspecified: Secondary | ICD-10-CM

## 2018-11-22 NOTE — Progress Notes (Signed)
Called and discussed with patient. Reported LMP as 10/25/2018 so this would be a cycle day 25 lab.

## 2018-11-23 ENCOUNTER — Other Ambulatory Visit: Payer: Self-pay

## 2018-11-23 ENCOUNTER — Other Ambulatory Visit: Payer: No Typology Code available for payment source

## 2018-11-23 DIAGNOSIS — N839 Noninflammatory disorder of ovary, fallopian tube and broad ligament, unspecified: Secondary | ICD-10-CM

## 2018-11-24 LAB — PROGESTERONE: Progesterone: 0.1 ng/mL

## 2018-11-30 ENCOUNTER — Ambulatory Visit: Payer: No Typology Code available for payment source | Admitting: Maternal Newborn

## 2018-12-06 ENCOUNTER — Ambulatory Visit: Payer: No Typology Code available for payment source | Admitting: Obstetrics and Gynecology

## 2018-12-09 ENCOUNTER — Encounter: Payer: Self-pay | Admitting: Obstetrics and Gynecology

## 2018-12-09 ENCOUNTER — Other Ambulatory Visit: Payer: Self-pay

## 2018-12-09 ENCOUNTER — Ambulatory Visit (INDEPENDENT_AMBULATORY_CARE_PROVIDER_SITE_OTHER): Payer: No Typology Code available for payment source | Admitting: Obstetrics and Gynecology

## 2018-12-09 VITALS — BP 124/88 | Ht 64.0 in | Wt 197.0 lb

## 2018-12-09 DIAGNOSIS — N839 Noninflammatory disorder of ovary, fallopian tube and broad ligament, unspecified: Secondary | ICD-10-CM

## 2018-12-09 DIAGNOSIS — Z131 Encounter for screening for diabetes mellitus: Secondary | ICD-10-CM

## 2018-12-09 DIAGNOSIS — N979 Female infertility, unspecified: Secondary | ICD-10-CM

## 2018-12-09 NOTE — Progress Notes (Signed)
   Patient ID: Vicki Mcdowell, female   DOB: [birth year], 33 y.o.   MRN: [id number]  Reason for Consult: Follow-up   Referred by Hamrick, Lorin Mercy, MD  Subjective:     HPI:  Vicki Mcdowell is a 33 y.o. female. She presents today for ongoing evaluation and treatment of infertility. Last month she completed ovulation testing with progesterone levels which were negative. She has not been able to purchase [facility] testing kits. Her partner has been working a lot and has not been able to to perform the semen analysis. Her lab work was not all collected at the time of her last lab appointment so that needs to be compelted. She reports her LMP was 11/22/2018.    Past Medical History:  Diagnosis Date  . VP (ventriculoperitoneal) shunt status    History reviewed. No pertinent family history. Past Surgical History:  Procedure Laterality Date  . DG HYSTEROGRAM (HSG)  10/28/2018      . hydroceph    . SHUNT REVISION VENTRICULAR-PERITONEAL      Short Social History:  Social History   Tobacco Use  . Smoking status: Never Smoker  . Smokeless tobacco: Never Used  Substance Use Topics  . Alcohol use: Never    Frequency: Never    No Known Allergies  Current Outpatient Medications  Medication Sig Dispense Refill  . terbinafine (LAMISIL) 250 MG tablet Take 250 mg by mouth daily.     No current facility-administered medications for this visit.     REVIEW OF SYSTEMS      Objective:  Objective   Vitals:   12/09/18 1624  BP: 124/88  Weight: 197 lb (89.4 kg)  Height: 5\' 4"  (1.626 m)   Body mass index is 33.81 kg/m.  Physical Exam Vitals signs and nursing note reviewed.  Constitutional:      Appearance: She is well-developed.  HENT:     Head: Normocephalic and atraumatic.  Eyes:     Pupils: Pupils are equal, round, and reactive to light.  Cardiovascular:     Rate and Rhythm: Normal rate and regular rhythm.  Pulmonary:     Effort: Pulmonary effort is normal. No respiratory  distress.  Skin:    General: Skin is warm and dry.  Neurological:     Mental Status: She is alert and oriented to person, place, and time.  Psychiatric:        Behavior: Behavior normal.        Thought Content: Thought content normal.        Judgment: Judgment normal.        Assessment/Plan:     33yo here for ongoing evaluation and treatment of infertility Will need her infertility labs tomorrow Ovulation from last period was negative, Assuming labs are normal plan to proceed with ovulation induction Reviewed letrozole medication regimen with patient in detail.  Patient has not been able to get home [place] test strips- will need to come to the office for Progesterone testing instead. Significant other has not yet done semen analysis testing. He will complete when he is able.  Continue prenatal vitamin.  Follow up on 01/14/2019  More than 30 minutes were spent face to face with the patient in the room with more than 50% of the time spent providing counseling and discussing the plan of management.    Adrian Prows MD [facility], [facility] 12/09/2018 5:17 PM

## 2018-12-10 ENCOUNTER — Other Ambulatory Visit: Payer: No Typology Code available for payment source

## 2018-12-10 ENCOUNTER — Other Ambulatory Visit: Payer: Self-pay

## 2018-12-10 DIAGNOSIS — N979 Female infertility, unspecified: Secondary | ICD-10-CM

## 2018-12-10 DIAGNOSIS — Z131 Encounter for screening for diabetes mellitus: Secondary | ICD-10-CM

## 2018-12-15 LAB — PROLACTIN: Prolactin: 13.7 ng/mL (ref 4.8–23.3)

## 2018-12-15 LAB — HEMOGLOBIN A1C
Est. average glucose Bld gHb Est-mCnc: 108 mg/dL
Hgb A1c MFr Bld: 5.4 % (ref 4.8–5.6)

## 2018-12-15 LAB — FSH/LH
FSH: 7.1 m[IU]/mL
LH: 23.4 m[IU]/mL

## 2018-12-15 LAB — TSH+FREE T4
Free T4: 1.26 ng/dL (ref 0.82–1.77)
TSH: 1.15 u[IU]/mL (ref 0.450–4.500)

## 2018-12-15 LAB — ESTRADIOL: Estradiol: 48.7 pg/mL

## 2018-12-15 LAB — ANTI MULLERIAN HORMONE: ANTI-MULLERIAN HORMONE (AMH): <0.015 ng/mL

## 2018-12-16 ENCOUNTER — Telehealth: Payer: Self-pay

## 2018-12-16 NOTE — Telephone Encounter (Signed)
Pt calling for blood work results from Friday.  [phone]

## 2018-12-20 ENCOUNTER — Other Ambulatory Visit: Payer: Self-pay | Admitting: Obstetrics and Gynecology

## 2018-12-20 DIAGNOSIS — N979 Female infertility, unspecified: Secondary | ICD-10-CM

## 2018-12-20 NOTE — Telephone Encounter (Signed)
Please advise 

## 2018-12-20 NOTE — Telephone Encounter (Signed)
Returned call

## 2018-12-20 NOTE — Progress Notes (Signed)
Discussed with patient on the phone, low AMH

## 2019-01-13 ENCOUNTER — Other Ambulatory Visit: Payer: Self-pay

## 2019-01-13 ENCOUNTER — Ambulatory Visit (INDEPENDENT_AMBULATORY_CARE_PROVIDER_SITE_OTHER): Payer: No Typology Code available for payment source | Admitting: Obstetrics and Gynecology

## 2019-01-13 ENCOUNTER — Encounter: Payer: Self-pay | Admitting: Obstetrics and Gynecology

## 2019-01-13 VITALS — BP 112/72 | HR 65 | Ht 64.0 in | Wt 189.0 lb

## 2019-01-13 DIAGNOSIS — N979 Female infertility, unspecified: Secondary | ICD-10-CM | POA: Diagnosis not present

## 2019-01-13 DIAGNOSIS — N839 Noninflammatory disorder of ovary, fallopian tube and broad ligament, unspecified: Secondary | ICD-10-CM

## 2019-01-13 NOTE — Progress Notes (Signed)
Patient ID: Vicki Mcdowell, female   DOB: 08-07-1985, 33 y.o.   MRN: [id number]  Reason for Consult: Follow-up   Referred by Hamrick, Lorin Mercy, MD  Subjective:     HPI:  Vicki Mcdowell is a 33 y.o. female. She presents today for continued management of infertility. She was referred to [facility], but the co-pay for a visit is too expensive for her at this time. She would like to pursue any other management options. Her partner has not yet been able to complete his semen-analysis because of work.   Normal HSG Ovulation testing was not indicative of ovulation before Low AMH Normal FSH  Past Medical History:  Diagnosis Date  . VP (ventriculoperitoneal) shunt status    History reviewed. No pertinent family history. Past Surgical History:  Procedure Laterality Date  . DG HYSTEROGRAM (HSG)  10/28/2018      . hydroceph    . SHUNT REVISION VENTRICULAR-PERITONEAL      Short Social History:  Social History   Tobacco Use  . Smoking status: Never Smoker  . Smokeless tobacco: Never Used  Substance Use Topics  . Alcohol use: Never    No Known Allergies  Current Outpatient Medications  Medication Sig Dispense Refill  . terbinafine (LAMISIL) 250 MG tablet Take 250 mg by mouth daily.     No current facility-administered medications for this visit.    Review of Systems  Constitutional: Negative for chills, fatigue, fever and unexpected weight change.  HENT: Negative for trouble swallowing.  Eyes: Negative for loss of vision.  Respiratory: Negative for cough, shortness of breath and wheezing.  Cardiovascular: Negative for chest pain, leg swelling, palpitations and syncope.  GI: Negative for abdominal pain, blood in stool, diarrhea, nausea and vomiting.  GU: Negative for difficulty urinating, dysuria, frequency and hematuria.  Musculoskeletal: Negative for back pain, leg pain and joint pain.  Skin: Negative for rash.  Neurological: Negative for dizziness, headaches,  light-headedness, numbness and seizures.  Psychiatric: Negative for behavioral problem, confusion, depressed mood and sleep disturbance.        Objective:  Objective   Vitals:   01/13/19 1613  BP: 112/72  Pulse: 65  Weight: 189 lb (85.7 kg)  Height: 5\' 4"  (1.626 m)   Body mass index is 32.44 kg/m.  Physical Exam Vitals and nursing note reviewed.  Constitutional:      Appearance: She is well-developed.  HENT:     Head: Normocephalic and atraumatic.  Cardiovascular:     Rate and Rhythm: Normal rate and regular rhythm.  Pulmonary:     Effort: Pulmonary effort is normal.     Breath sounds: Normal breath sounds.  Abdominal:     General: Bowel sounds are normal.     Palpations: Abdomen is soft.  Musculoskeletal:        General: Normal range of motion.  Skin:    General: Skin is warm and dry.  Neurological:     Mental Status: She is alert and oriented to person, place, and time.  Psychiatric:        Behavior: Behavior normal.        Thought Content: Thought content normal.        Judgment: Judgment normal.       Assessment/Plan:     33 yo being seen for infertility evaluation and treatment.  LMP: 12/20/2018 Is currently 5 days late!! Declines in office pregnancy test today.  Can not afford to see infertility specialist at this time and would  like to proceed with letrozole treatments as a possible assistant with becoming pregnant (if she is not currently pregnant.) She understands that given her low AMH this is not an ideal treatment plan for conception, but it is what is inside her means at this time. She will return to discuss this regimen in further detail if needed.   More than 15 minutes were spent face to face with the patient in the room with more than 50% of the time spent providing counseling and discussing the plan of management.    Adelene Idler MD [facility], [facility] 01/24/2019 6:04 PM

## 2019-01-13 NOTE — Patient Instructions (Signed)
Ovulation Induction Instructions/Schedule: To use with Letrozole  (Femara)  1. Day 1 of your cycle is the first day of bleeding, whether it happens with a naturally occurring period or is induced with progesterone. Starting on Day 3 of the cycle, take the prescribed medication Letrozole (Femara) for five consecutive days.   2. Starting on Day 9, you should check your urine daily for ovulation with a commercially available ovulation predictor kit. We recommend the Clear Blue Easy kit for all of our patients because it is easy to interpret. It is available at amazon.com for low cost. There will be a "smiley face" that appears on the day of "surge," which is release of LH (leutenizing hormone). This hormone triggers ovulation. Most women will ovulate, or release an egg, 24 hours after this trigger. Other commercially available kits use a system similar to a pregnancy test kit, with a control line and a test line. When the test line brightness of color is equivalent to that of the control line, the result is positive.   3. Begin timed intercourse on Day 9. Specifically, we recommend that you have intercourse every 36-48 hours for 5 days prior to ovulation and for 5 days after. The most fertile time will be 24 hours after the "smiley face" appears on your kit, usually around Day 14 for women who have a 28 day cycle.   4. If you do not have a "smiley face" or color change on your ovulation predictor kit during your cycle, please contact us. A blood test for progesterone needs to be drawn at approximately Days 22-24 of your cycle. This will help us determine whether or not the kit is functioning properly and whether or not you are truly ovulating. It will also help us to determine if the dose of your medication needs to adjustment.   5. If no menstrual bleeding has occurred by Day 35, please check a pregnancy test and call us with the results.   6. If you require medication to have a period, then this will be  artificially induced with progesterone for each cycle, and prescribed by your doctor. Take one tablet of medroxyprogesterone acetate, 10 mg, per day for ten days. The bleeding/period should arrive within 1-5 days of finishing the tablets. The first day of bleeding is Day 1.   7. PLEASE NOTE that careful timing is required to use these medications and appropriately time your cycles. We rely on you, the patient, to time your cycles and to keep us informed. GET A CALENDAR! There are wonderful apps available for your mobile phone or laptop. One that we like is fertility friend, available at fertilityfriend.com.    8. Occasionally it is unclear whether or not ovulation has occurred. If you are unsure, please call us. Your doctor or one of our nurses will guide you to the next step, which will be either a blood test Day 22-24 for progesterone levels or may involve ultrasound to look for mature follicles (small cysts that can release an egg at the time of ovulation).  9. There is a great book, Taking Charge of Your Fertility, by Toni Weschler, that can help you to understand the hormonal nature of the menstrual cycle. The more knowledge you have about how conception occurs, the better you will understand these prescribed treatments. Knowledge will help you to work with us on achieving a conception as efficiently and as quickly as possible!   10. Generally, most patients will use ovulation induction medications for 6 cycles.   If unsuccessful, your doctor will talk to you about moving to the next step.

## 2019-01-24 ENCOUNTER — Other Ambulatory Visit: Payer: Self-pay | Admitting: Obstetrics and Gynecology

## 2019-02-10 ENCOUNTER — Ambulatory Visit (INDEPENDENT_AMBULATORY_CARE_PROVIDER_SITE_OTHER): Payer: No Typology Code available for payment source | Admitting: Obstetrics and Gynecology

## 2019-02-10 ENCOUNTER — Other Ambulatory Visit: Payer: Self-pay

## 2019-02-10 ENCOUNTER — Encounter: Payer: Self-pay | Admitting: Obstetrics and Gynecology

## 2019-02-10 VITALS — BP 122/80 | Ht 64.0 in | Wt 197.0 lb

## 2019-02-10 DIAGNOSIS — N839 Noninflammatory disorder of ovary, fallopian tube and broad ligament, unspecified: Secondary | ICD-10-CM

## 2019-02-10 DIAGNOSIS — N979 Female infertility, unspecified: Secondary | ICD-10-CM | POA: Diagnosis not present

## 2019-02-10 MED ORDER — LETROZOLE 2.5 MG PO TABS: 5.0000 mg | ORAL_TABLET | Freq: Every day | ORAL | 0 refills | Status: DC

## 2019-02-10 NOTE — Patient Instructions (Signed)
Ovulation Induction Instructions/Schedule: To use with or letrozole (Femara)  1. Day 1 of your cycle is the first day of bleeding, whether it happens with a naturally occurring period or is induced with progesterone. Starting on Day 3 of the cycle, take the prescribed medication  Letrozole (Femara) for five consecutive days.   2. Starting on Day 9, you should check your urine daily for ovulation with a commercially available ovulation predictor kit. We recommend the Clear Blue Easy kit for all of our patients because it is easy to interpret. It is available at [email or web address] for low cost. There will be a "smiley face" that appears on the day of "surge," which is release of LH (leutenizing hormone). This hormone triggers ovulation. Most women will ovulate, or release an egg, 24 hours after this trigger. Other commercially available kits use a system similar to a pregnancy test kit, with a control line and a test line. When the test line brightness of color is equivalent to that of the control line, the result is positive.   3. Begin timed intercourse on Day 9. Specifically, we recommend that you have intercourse every 36-48 hours for 5 days prior to ovulation and for 5 days after. The most fertile time will be 24 hours after the "smiley face" appears on your kit, usually around Day 14 for women who have a 28 day cycle.   4. If you do not have a "smiley face" or color change on your ovulation predictor kit during your cycle, please contact us. A blood test for progesterone needs to be drawn at approximately Days 22-24 of your cycle. This will help [place] determine whether or not the kit is functioning properly and whether or not you are truly ovulating. It will also help [place] to determine if the dose of your medication needs to adjustment.   5. If no menstrual bleeding has occurred by Day 35, please check a pregnancy test and call us with the results.   6. If you require medication to have a period, then this will be  artificially induced with progesterone for each cycle, and prescribed by your doctor. Take one tablet of medroxyprogesterone acetate, 10 mg, per day for ten days. The bleeding/period should arrive within 1-5 days of finishing the tablets. The first day of bleeding is Day 1.   7. PLEASE NOTE that careful timing is required to use these medications and appropriately time your cycles. We rely on you, the patient, to time your cycles and to keep [place] informed. GET A CALENDAR! There are wonderful apps available for your mobile phone or laptop. One that we like is fertility friend, available at [employer].at.    27. Occasionally it is unclear whether or not ovulation has occurred. If you are unsure, please call us. Your doctor or one of our nurses will guide you to the next step, which will be either a blood test Day 22-24 for progesterone levels or may involve ultrasound to look for mature follicles (small cysts that can release an egg at the time of ovulation).  9. There is a great book, Taking Charge of Your Fertility, by Desma Paganini, that can help you to understand the hormonal nature of the menstrual cycle. The more knowledge you have about how conception occurs, the better you will understand these prescribed treatments. Knowledge will help you to work with [place] on achieving a conception as efficiently and as quickly as possible!   10. Generally, most patients will use ovulation induction medications for 6  If unsuccessful, your doctor will talk to you about moving to the next step.  

## 2019-02-14 NOTE — Progress Notes (Signed)
Patient ID: Vicki Mcdowell, female   DOB: 1985-03-25, 34 y.o.   MRN: [id number]  Reason for Consult: Follow-up (No period in 2 months, negative pregnancy test)   Referred by Leonides Sake, MD  Subjective:     HPI:  Vicki Mcdowell is a 33 y.o. female. She is following up today. She has missed her period in December, but urine pregnancy test is negative. This is unusual for the patient since she generally ovulates monthly. She would like to proceed with ovulation induction medication. She has made an appointment to see REI in March 2021 which I have advised her to keep. She is not having symptoms of menopause such as hot flashes.     Past Medical History:  Diagnosis Date  . VP (ventriculoperitoneal) shunt status    History reviewed. No pertinent family history. Past Surgical History:  Procedure Laterality Date  . DG HYSTEROGRAM (HSG)  10/28/2018      . hydroceph    . SHUNT REVISION VENTRICULAR-PERITONEAL      Short Social History:  Social History   Tobacco Use  . Smoking status: Never Smoker  . Smokeless tobacco: Never Used  Substance Use Topics  . Alcohol use: Never    No Known Allergies  Current Outpatient Medications  Medication Sig Dispense Refill  . letrozole (FEMARA) 2.5 MG tablet Take 2 tablets (5 mg total) by mouth daily. 10 tablet 0  . terbinafine (LAMISIL) 250 MG tablet Take 250 mg by mouth daily.     No current facility-administered medications for this visit.    Review of Systems  Constitutional: Negative for chills, fatigue, fever and unexpected weight change.  HENT: Negative for trouble swallowing.  Eyes: Negative for loss of vision.  Respiratory: Negative for cough, shortness of breath and wheezing.  Cardiovascular: Negative for chest pain, leg swelling, palpitations and syncope.  GI: Negative for abdominal pain, blood in stool, diarrhea, nausea and vomiting.  GU: Negative for difficulty urinating, dysuria, frequency and hematuria.    Musculoskeletal: Negative for back pain, leg pain and joint pain.  Skin: Negative for rash.  Neurological: Negative for dizziness, headaches, light-headedness, numbness and seizures.  Psychiatric: Negative for behavioral problem, confusion, depressed mood and sleep disturbance.        Objective:  Objective   Vitals:   02/10/19 1608  BP: 122/80  Weight: 197 lb (89.4 kg)  Height: 5\' 4"  (1.626 m)   Body mass index is 33.81 kg/m.  Physical Exam Vitals and nursing note reviewed.  Constitutional:      Appearance: She is well-developed.  HENT:     Head: Normocephalic and atraumatic.  Eyes:     Pupils: Pupils are equal, round, and reactive to light.  Cardiovascular:     Rate and Rhythm: Normal rate and regular rhythm.  Pulmonary:     Effort: Pulmonary effort is normal. No respiratory distress.  Skin:    General: Skin is warm and dry.  Neurological:     Mental Status: She is alert and oriented to person, place, and time.  Psychiatric:        Behavior: Behavior normal.        Thought Content: Thought content normal.        Judgment: Judgment normal.         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  Will proceed with ovulation induction. Patient has had difficulty obtaining home [facility] kits in the past. So will plan for  office progesterone labs day 20-24.  Discussed timing of medication and intercourse to help with conception.  Encouraged her to keep her appointment with REI.   More than 15 minutes were spent face to face with the patient in the room with more than 50% of the time spent providing counseling and discussing the plan of management.    Adelene Idler MD [facility], [facility] 02/14/2019 7:23 AM

## 2019-03-15 ENCOUNTER — Telehealth: Payer: Self-pay

## 2019-03-15 NOTE — Telephone Encounter (Signed)
Pt calling; started period Monday; is changing pad and tampon every hour and breasts are so sore she can't touch them.  [phone]  Adv it is our policy if saturating a pad every hour to go to ED.  Pt is soaking through pad and tampon every hour.  Adv again to go to ED.

## 2019-03-18 ENCOUNTER — Ambulatory Visit (INDEPENDENT_AMBULATORY_CARE_PROVIDER_SITE_OTHER): Payer: No Typology Code available for payment source | Admitting: Obstetrics and Gynecology

## 2019-03-18 ENCOUNTER — Other Ambulatory Visit: Payer: Self-pay

## 2019-03-18 ENCOUNTER — Encounter: Payer: Self-pay | Admitting: Obstetrics and Gynecology

## 2019-03-18 DIAGNOSIS — N979 Female infertility, unspecified: Secondary | ICD-10-CM

## 2019-03-18 DIAGNOSIS — N839 Noninflammatory disorder of ovary, fallopian tube and broad ligament, unspecified: Secondary | ICD-10-CM

## 2019-03-18 DIAGNOSIS — N838 Other noninflammatory disorders of ovary, fallopian tube and broad ligament: Secondary | ICD-10-CM

## 2019-03-18 MED ORDER — LETROZOLE 2.5 MG PO TABS: 7.5000 mg | ORAL_TABLET | Freq: Every day | ORAL | 1 refills | Status: DC

## 2019-03-18 NOTE — Progress Notes (Signed)
Patient ID: Vicki Mcdowell, female   DOB: [birth year], 34 y.o.   MRN: [id number]  Reason for Consult: Follow-up (U/S follow up )   Referred by Hamrick, Lorin Mercy, MD  Subjective:     HPI:  Vicki Mcdowell is a 34 y.o. female. She is following up today for infertility. She took letrozole last month, home LH tests did not definitively show ovulation. She did not have day 20 progesterone testing.  She has planned follow up with [facility] on March 4th.  Her period started 04/11/2019.   Past Medical History:  Diagnosis Date  . VP (ventriculoperitoneal) shunt status    History reviewed. No pertinent family history. Past Surgical History:  Procedure Laterality Date  . DG HYSTEROGRAM (HSG)  10/28/2018      . hydroceph    . SHUNT REVISION VENTRICULAR-PERITONEAL      Short Social History:  Social History   Tobacco Use  . Smoking status: Never Smoker  . Smokeless tobacco: Never Used  Substance Use Topics  . Alcohol use: Never    No Known Allergies  Current Outpatient Medications  Medication Sig Dispense Refill  . letrozole (FEMARA) 2.5 MG tablet Take 3 tablets (7.5 mg total) by mouth daily. 15 tablet 1   No current facility-administered medications for this visit.    Review of Systems  Constitutional: Negative for chills, fatigue, fever and unexpected weight change.  HENT: Negative for trouble swallowing.  Eyes: Negative for loss of vision.  Respiratory: Negative for cough, shortness of breath and wheezing.  Cardiovascular: Negative for chest pain, leg swelling, palpitations and syncope.  GI: Negative for abdominal pain, blood in stool, diarrhea, nausea and vomiting.  GU: Negative for difficulty urinating, dysuria, frequency and hematuria.  Musculoskeletal: Negative for back pain, leg pain and joint pain.  Skin: Negative for rash.  Neurological: Negative for dizziness, headaches, light-headedness, numbness and seizures.  Psychiatric: Negative for behavioral problem, confusion,  depressed mood and sleep disturbance.       Objective:  Objective   Vitals:   03/18/19 1551  BP: 110/68  Weight: 193 lb (87.5 kg)  Height: 5\' 4"  (1.626 m)   Body mass index is 33.13 kg/m.  Physical Exam Vitals and nursing note reviewed.  Constitutional:      Appearance: She is well-developed.  HENT:     Head: Normocephalic and atraumatic.  Eyes:     Pupils: Pupils are equal, round, and reactive to light.  Cardiovascular:     Rate and Rhythm: Normal rate and regular rhythm.  Pulmonary:     Effort: Pulmonary effort is normal. No respiratory distress.  Skin:    General: Skin is warm and dry.  Neurological:     Mental Status: She is alert and oriented to person, place, and time.  Psychiatric:        Behavior: Behavior normal.        Thought Content: Thought content normal.        Judgment: Judgment normal.        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 Start 7.5mg  today (cycle day 5) .  Follow up for progesterone testing on 04/01/2018.  Continue with regular every other day intercourse during fertile window of cycle days 10-21.   More than 15 minutes were spent face to face with the patient in the room with more than 50% of the time spent providing counseling and discussing the plan of management. We discussed the plan  of care.   Adelene Idler MD [facility], [facility] 03/18/2019 4:27 PM

## 2019-04-01 ENCOUNTER — Other Ambulatory Visit: Payer: No Typology Code available for payment source

## 2019-04-01 ENCOUNTER — Other Ambulatory Visit: Payer: Self-pay

## 2019-04-01 DIAGNOSIS — N839 Noninflammatory disorder of ovary, fallopian tube and broad ligament, unspecified: Secondary | ICD-10-CM

## 2019-04-02 LAB — PROGESTERONE: Progesterone: 0.8 ng/mL

## 2019-04-08 ENCOUNTER — Encounter: Payer: Self-pay | Admitting: Obstetrics and Gynecology

## 2019-04-08 ENCOUNTER — Ambulatory Visit (INDEPENDENT_AMBULATORY_CARE_PROVIDER_SITE_OTHER): Payer: No Typology Code available for payment source | Admitting: Obstetrics and Gynecology

## 2019-04-08 ENCOUNTER — Ambulatory Visit: Payer: No Typology Code available for payment source | Admitting: Obstetrics and Gynecology

## 2019-04-08 ENCOUNTER — Other Ambulatory Visit: Payer: Self-pay

## 2019-04-08 VITALS — BP 120/80 | Ht 62.0 in | Wt 192.0 lb

## 2019-04-08 DIAGNOSIS — N979 Female infertility, unspecified: Secondary | ICD-10-CM | POA: Diagnosis not present

## 2019-04-08 DIAGNOSIS — N839 Noninflammatory disorder of ovary, fallopian tube and broad ligament, unspecified: Secondary | ICD-10-CM | POA: Diagnosis not present

## 2019-04-09 LAB — PROGESTERONE: Progesterone: 4.1 ng/mL

## 2019-04-10 NOTE — Progress Notes (Signed)
   Patient ID: Vicki Mcdowell, female   DOB: [birth year], 34 y.o.   MRN: [id number]  Reason for Consult: Follow-up   Referred by Hamrick, Durward Fortes, MD  Subjective:     HPI:  Vicki Mcdowell is a 34 y.o. female. She presents today for continued follow up related to infertility. She has been taking letrozole 7.5mg  for ovulation induction. Her initial ovulation testing (progesterone level) last month was low, but repeat 1 week later was suggestive of ovulation. The patient is doing home LH test strips and they also seem to indicate that she is ovulating later in her cycle. She has no complaints and is otherwise feeling well. She did have an appointment with [facility], but she was unable to go this month because of a scheduling conflict.    Past Medical History:  Diagnosis Date  . VP (ventriculoperitoneal) shunt status    History reviewed. No pertinent family history. Past Surgical History:  Procedure Laterality Date  . DG HYSTEROGRAM (HSG)  10/28/2018      . hydroceph    . SHUNT REVISION VENTRICULAR-PERITONEAL      Short Social History:  Social History   Tobacco Use  . Smoking status: Never Smoker  . Smokeless tobacco: Never Used  Substance Use Topics  . Alcohol use: Never    No Known Allergies  Current Outpatient Medications  Medication Sig Dispense Refill  . letrozole (FEMARA) 2.5 MG tablet Take 3 tablets (7.5 mg total) by mouth daily. 15 tablet 1   No current facility-administered medications for this visit.    REVIEW OF SYSTEMS      Objective:  Objective   Vitals:   04/08/19 1314  BP: 120/80  Weight: 192 lb (87.1 kg)  Height: 5\' 2"  (1.575 m)   Body mass index is 35.12 kg/m.  Physical Exam Vitals and nursing note reviewed.  Constitutional:      Appearance: She is well-developed.  HENT:     Head: Normocephalic and atraumatic.  Eyes:     Pupils: Pupils are equal, round, and reactive to light.  Cardiovascular:     Rate and Rhythm: Normal rate and regular  rhythm.  Pulmonary:     Effort: Pulmonary effort is normal. No respiratory distress.  Skin:    General: Skin is warm and dry.  Neurological:     Mental Status: She is alert and oriented to person, place, and time.  Psychiatric:        Behavior: Behavior normal.        Thought Content: Thought content normal.        Judgment: Judgment normal.         Assessment/Plan:     34 yo undergoing ovulation induction here fore continued follow up and monitoring.  Will continue with Letrozole 7.5mg  on cycle days 3-8. She will continue home LH testing strips and return for day 20 labs next month. Reviewed timing of intercourse and interpretation of LH test strips.   More than 20 minutes were spent face to face with the patient in the room, reviewing the medical record, labs and images, and coordinating care for the patient. The plan of management was discussed in detail and counseling was provided.     20 MD [facility], [facility] 04/10/2019 10:08 AM

## 2019-04-25 ENCOUNTER — Other Ambulatory Visit: Payer: Self-pay | Admitting: Obstetrics and Gynecology

## 2019-04-25 DIAGNOSIS — N839 Noninflammatory disorder of ovary, fallopian tube and broad ligament, unspecified: Secondary | ICD-10-CM

## 2019-04-25 NOTE — Telephone Encounter (Signed)
Patient is calling to speak with Dr. Jerene Pitch about the following question sent. Please advise

## 2019-04-25 NOTE — Telephone Encounter (Signed)
Please call to schedule a lab appointment for the patient on April 14th- thank you

## 2019-05-11 ENCOUNTER — Other Ambulatory Visit: Payer: Self-pay

## 2019-05-11 ENCOUNTER — Other Ambulatory Visit: Payer: No Typology Code available for payment source

## 2019-05-11 DIAGNOSIS — N839 Noninflammatory disorder of ovary, fallopian tube and broad ligament, unspecified: Secondary | ICD-10-CM

## 2019-05-12 ENCOUNTER — Other Ambulatory Visit: Payer: Self-pay | Admitting: Obstetrics and Gynecology

## 2019-05-12 DIAGNOSIS — N839 Noninflammatory disorder of ovary, fallopian tube and broad ligament, unspecified: Secondary | ICD-10-CM

## 2019-05-12 DIAGNOSIS — N979 Female infertility, unspecified: Secondary | ICD-10-CM

## 2019-05-12 LAB — PROGESTERONE: Progesterone: 7.6 ng/mL

## 2019-05-12 MED ORDER — LETROZOLE 2.5 MG PO TABS: 7.5000 mg | ORAL_TABLET | Freq: Every day | ORAL | 5 refills | Status: DC

## 2019-06-02 NOTE — Telephone Encounter (Signed)
Patient is calling to follow up on getting her labs drawn. Patient is aware that [facility] is out of the office today and will return tomorrow. Please advise scheduling for lab

## 2019-06-10 ENCOUNTER — Other Ambulatory Visit: Payer: No Typology Code available for payment source

## 2019-06-10 ENCOUNTER — Other Ambulatory Visit: Payer: Self-pay | Admitting: Obstetrics and Gynecology

## 2019-06-10 ENCOUNTER — Other Ambulatory Visit: Payer: Self-pay

## 2019-06-10 DIAGNOSIS — N839 Noninflammatory disorder of ovary, fallopian tube and broad ligament, unspecified: Secondary | ICD-10-CM

## 2019-06-11 LAB — PROGESTERONE: Progesterone: 18.4 ng/mL

## 2019-06-22 ENCOUNTER — Other Ambulatory Visit: Payer: Self-pay | Admitting: Obstetrics and Gynecology

## 2019-06-22 ENCOUNTER — Telehealth: Payer: Self-pay

## 2019-06-22 DIAGNOSIS — Z3490 Encounter for supervision of normal pregnancy, unspecified, unspecified trimester: Secondary | ICD-10-CM

## 2019-06-22 NOTE — Telephone Encounter (Signed)
Patient went to fertility clinic yesterday. She was told she is 5 wks as of today. Patient inquiring what to do? AV#[phone]

## 2019-06-30 ENCOUNTER — Telehealth: Payer: Self-pay

## 2019-06-30 NOTE — Telephone Encounter (Signed)
Pt left msg on triage saying she thinks she has a yeast inf. Called her to get more details. There is excessive discharge only when she wipes and itching. No odor or any UTI sx. Advised monistat for a few days and follow up as needed.

## 2019-07-08 ENCOUNTER — Encounter: Payer: Self-pay | Admitting: Obstetrics and Gynecology

## 2019-07-08 ENCOUNTER — Encounter: Payer: No Typology Code available for payment source | Admitting: Obstetrics and Gynecology

## 2019-07-08 ENCOUNTER — Other Ambulatory Visit: Payer: No Typology Code available for payment source

## 2019-07-08 ENCOUNTER — Ambulatory Visit (INDEPENDENT_AMBULATORY_CARE_PROVIDER_SITE_OTHER): Payer: No Typology Code available for payment source | Admitting: Obstetrics and Gynecology

## 2019-07-08 ENCOUNTER — Other Ambulatory Visit (HOSPITAL_COMMUNITY)
Admission: RE | Admit: 2019-07-08 | Discharge: 2019-07-08 | Disposition: A | Discharge status: Home / Self Care | Payer: Medicaid Other | Source: Ambulatory Visit | Attending: Obstetrics and Gynecology | Admitting: Obstetrics and Gynecology

## 2019-07-08 ENCOUNTER — Other Ambulatory Visit: Payer: Self-pay | Admitting: Obstetrics and Gynecology

## 2019-07-08 ENCOUNTER — Other Ambulatory Visit: Payer: Self-pay

## 2019-07-08 ENCOUNTER — Ambulatory Visit (INDEPENDENT_AMBULATORY_CARE_PROVIDER_SITE_OTHER): Payer: No Typology Code available for payment source

## 2019-07-08 VITALS — BP 120/70 | Ht 64.0 in | Wt 187.0 lb

## 2019-07-08 DIAGNOSIS — Z3480 Encounter for supervision of other normal pregnancy, unspecified trimester: Secondary | ICD-10-CM | POA: Diagnosis not present

## 2019-07-08 DIAGNOSIS — Z3401 Encounter for supervision of normal first pregnancy, first trimester: Secondary | ICD-10-CM

## 2019-07-08 DIAGNOSIS — Z3490 Encounter for supervision of normal pregnancy, unspecified, unspecified trimester: Secondary | ICD-10-CM

## 2019-07-08 DIAGNOSIS — Z3A01 Less than 8 weeks gestation of pregnancy: Secondary | ICD-10-CM

## 2019-07-08 NOTE — Patient Instructions (Signed)
First Trimester of Pregnancy The first trimester of pregnancy is from week 1 until the end of week 13 (months 1 through 3). A week after a sperm fertilizes an egg, the egg will implant on the wall of the uterus. This embryo will begin to develop into a baby. Genes from you and your partner will form the baby. The female genes will determine whether the baby will be a boy or a girl. At 6-8 weeks, the eyes and face will be formed, and the heartbeat can be seen on ultrasound. At the end of 12 weeks, all the baby's organs will be formed. Now that you are pregnant, you will want to do everything you can to have a healthy baby. Two of the most important things are to get good prenatal care and to follow your health care provider's instructions. Prenatal care is all the medical care you receive before the baby's birth. This care will help prevent, find, and treat any problems during the pregnancy and childbirth. Body changes during your first trimester Your body goes through many changes during pregnancy. The changes vary from woman to woman.  You may gain or lose a couple of pounds at first.  You may feel sick to your stomach (nauseous) and you may throw up (vomit). If the vomiting is uncontrollable, call your health care provider.  You may tire easily.  You may develop headaches that can be relieved by medicines. All medicines should be approved by your health care provider.  You may urinate more often. Painful urination may mean you have a bladder infection.  You may develop heartburn as a result of your pregnancy.  You may develop constipation because certain hormones are causing the muscles that push stool through your intestines to slow down.  You may develop hemorrhoids or swollen veins (varicose veins).  Your breasts may begin to grow larger and become tender. Your nipples may stick out more, and the tissue that surrounds them (areola) may become darker.  Your gums may bleed and may be  sensitive to brushing and flossing.  Dark spots or blotches (chloasma, mask of pregnancy) may develop on your face. This will likely fade after the baby is born.  Your menstrual periods will stop.  You may have a loss of appetite.  You may develop cravings for certain kinds of food.  You may have changes in your emotions from day to day, such as being excited to be pregnant or being concerned that something may go wrong with the pregnancy and baby.  You may have more vivid and strange dreams.  You may have changes in your hair. These can include thickening of your hair, rapid growth, and changes in texture. Some women also have hair loss during or after pregnancy, or hair that feels dry or thin. Your hair will most likely return to normal after your baby is born. What to expect at prenatal visits During a routine prenatal visit:  You will be weighed to make sure you and the baby are growing normally.  Your blood pressure will be taken.  Your abdomen will be measured to track your baby's growth.  The fetal heartbeat will be listened to between weeks 10 and 14 of your pregnancy.  Test results from any previous visits will be discussed. Your health care provider may ask you:  How you are feeling.  If you are feeling the baby move.  If you have had any abnormal symptoms, such as leaking fluid, bleeding, severe headaches, or abdominal   cramping.  If you are using any tobacco products, including cigarettes, chewing tobacco, and electronic cigarettes.  If you have any questions. Other tests that may be performed during your first trimester include:  Blood tests to find your blood type and to check for the presence of any previous infections. The tests will also be used to check for low iron levels (anemia) and protein on red blood cells (Rh antibodies). Depending on your risk factors, or if you previously had diabetes during pregnancy, you may have tests to check for high blood sugar  that affects pregnant women (gestational diabetes).  Urine tests to check for infections, diabetes, or protein in the urine.  An ultrasound to confirm the proper growth and development of the baby.  Fetal screens for spinal cord problems (spina bifida) and Down syndrome.  HIV (human immunodeficiency virus) testing. Routine prenatal testing includes screening for HIV, unless you choose not to have this test.  You may need other tests to make sure you and the baby are doing well. Follow these instructions at home: Medicines  Follow your health care provider's instructions regarding medicine use. Specific medicines may be either safe or unsafe to take during pregnancy.  Take a prenatal vitamin that contains at least 600 micrograms (mcg) of folic acid.  If you develop constipation, try taking a stool softener if your health care provider approves. Eating and drinking   Eat a balanced diet that includes fresh fruits and vegetables, whole grains, good sources of protein such as meat, eggs, or tofu, and low-fat dairy. Your health care provider will help you determine the amount of weight gain that is right for you.  Avoid raw meat and uncooked cheese. These carry germs that can cause birth defects in the baby.  Eating four or five small meals rather than three large meals a day may help relieve nausea and vomiting. If you start to feel nauseous, eating a few soda crackers can be helpful. Drinking liquids between meals, instead of during meals, also seems to help ease nausea and vomiting.  Limit foods that are high in fat and processed sugars, such as fried and sweet foods.  To prevent constipation: ? Eat foods that are high in fiber, such as fresh fruits and vegetables, whole grains, and beans. ? Drink enough fluid to keep your urine clear or pale yellow. Activity  Exercise only as directed by your health care provider. Most women can continue their usual exercise routine during  pregnancy. Try to exercise for 30 minutes at least 5 days a week. Exercising will help you: ? Control your weight. ? Stay in shape. ? Be prepared for labor and delivery.  Experiencing pain or cramping in the lower abdomen or lower back is a good sign that you should stop exercising. Check with your health care provider before continuing with normal exercises.  Try to avoid standing for long periods of time. Move your legs often if you must stand in one place for a long time.  Avoid heavy lifting.  Wear low-heeled shoes and practice good posture.  You may continue to have sex unless your health care provider tells you not to. Relieving pain and discomfort  Wear a good support bra to relieve breast tenderness.  Take warm sitz baths to soothe any pain or discomfort caused by hemorrhoids. Use hemorrhoid cream if your health care provider approves.  Rest with your legs elevated if you have leg cramps or low back pain.  If you develop varicose veins in   your legs, wear support hose. Elevate your feet for 15 minutes, 3-4 times a day. Limit salt in your diet. Prenatal care  Schedule your prenatal visits by the twelfth week of pregnancy. They are usually scheduled monthly at first, then more often in the last 2 months before delivery.  Write down your questions. Take them to your prenatal visits.  Keep all your prenatal visits as told by your health care provider. This is important. Safety  Wear your seat belt at all times when driving.  Make a list of emergency phone numbers, including numbers for family, friends, the hospital, and police and fire departments. General instructions  Ask your health care provider for a referral to a local prenatal education class. Begin classes no later than the beginning of month 6 of your pregnancy.  Ask for help if you have counseling or nutritional needs during pregnancy. Your health care provider can offer advice or refer you to specialists for help  with various needs.  Do not use hot tubs, steam rooms, or saunas.  Do not douche or use tampons or scented sanitary pads.  Do not cross your legs for long periods of time.  Avoid cat litter boxes and soil used by cats. These carry germs that can cause birth defects in the baby and possibly loss of the fetus by miscarriage or stillbirth.  Avoid all smoking, herbs, alcohol, and medicines not prescribed by your health care provider. Chemicals in these products affect the formation and growth of the baby.  Do not use any products that contain nicotine or tobacco, such as cigarettes and e-cigarettes. If you need help quitting, ask your health care provider. You may receive counseling support and other resources to help you quit.  Schedule a dentist appointment. At home, brush your teeth with a soft toothbrush and be gentle when you floss. Contact a health care provider if:  You have dizziness.  You have mild pelvic cramps, pelvic pressure, or nagging pain in the abdominal area.  You have persistent nausea, vomiting, or diarrhea.  You have a bad smelling vaginal discharge.  You have pain when you urinate.  You notice increased swelling in your face, hands, legs, or ankles.  You are exposed to fifth disease or chickenpox.  You are exposed to German measles (rubella) and have never had it. Get help right away if:  You have a fever.  You are leaking fluid from your vagina.  You have spotting or bleeding from your vagina.  You have severe abdominal cramping or pain.  You have rapid weight gain or loss.  You vomit blood or material that looks like coffee grounds.  You develop a severe headache.  You have shortness of breath.  You have any kind of trauma, such as from a fall or a car accident. Summary  The first trimester of pregnancy is from week 1 until the end of week 13 (months 1 through 3).  Your body goes through many changes during pregnancy. The changes vary from  woman to woman.  You will have routine prenatal visits. During those visits, your health care provider will examine you, discuss any test results you may have, and talk with you about how you are feeling. This information is not intended to replace advice given to you by your health care provider. Make sure you discuss any questions you have with your health care provider. Document Revised: 12/26/2016 Document Reviewed: 12/26/2015 Elsevier Patient Education  2020 Elsevier Inc.  

## 2019-07-08 NOTE — Progress Notes (Signed)
07/08/2019   Chief Complaint: Missed period  Transfer of Care Patient: no  History of Present Illness: Vicki Mcdowell is a 34 y.o. G1P0000 Unknown based on Patient's last menstrual period was 05/18/2019. with an Estimated Date of Delivery: None noted., with the above CC.   Conception with help of Letrozole.  Her periods were: regular periods every 28 days She was using no method when she conceived.  She has Positive signs or symptoms of nausea/vomiting of pregnancy. She has Negative signs or symptoms of miscarriage or preterm labor She was not taking different medications around the time she conceived/early pregnancy. Since her LMP, she has not used alcohol Since her LMP, she has not used tobacco products Since her LMP, she has not used illegal drugs.    Current or past history of domestic violence. no  Infection History:  1. Since her LMP, she has had a viral illness.  2. She denies close contact with children on a regular basis     3. She has a history of chicken pox. She denies vaccination for chicken pox in the past. 4. Patient or partner has history of genital herpes  no 5. History of STI (GC, CT, HPV, syphilis, HIV)  no    6.  She does not live with someone with TB or TB exposed. 7. History of recent travel :  no 8. She identifies Negative Zika risk factors for her and her partner 34. There are not cats in the home in the home.  She understands that while pregnant she should not change cat litter.   Genetic Screening Questions: (Includes patient, baby's father, or anyone in either family)   1. Patient's age >/= 51 at [facility]  yes 2. Thalassemia ([place], [place], [place], or Asian background): MCV<80  no 3. Neural tube defect (meningomyelocele, spina bifida, anencephaly)  no 4. Congenital heart defect  no  5. Down syndrome  no 6. Tay-Sachs (Jewish, [place])  no 7. Canavan's Disease  no 8. Sickle cell disease or trait (African)  no  9. Hemophilia or other blood  disorders  no  10. Muscular dystrophy  no  11. Cystic fibrosis  no  12. Huntington's Chorea  no  13. Mental retardation/autism  no 14. Other inherited genetic or chromosomal disorder  no 15. Maternal metabolic disorder (DM, PKU, etc)  no 16. Patient or FOB with a child with a birth defect not listed above no  16a. Patient or FOB with a birth defect themselves no 17. Recurrent pregnancy loss, or stillbirth  no  18. Any medications since LMP other than prenatal vitamins (include vitamins, supplements, OTC meds, drugs, alcohol)  no 19. Any other genetic/environmental exposure to discuss  no  ROS:  ROS  OBGYN History: As per HPI. OB History  Gravida Para Term Preterm AB Living  1 0 0 0 0 0  SAB TAB Ectopic Multiple Live Births  0 0 0 0 0    # Outcome Date GA Lbr Len/2nd Weight Sex Delivery Anes PTL Lv  1 Current             Any issues with any prior pregnancies: no Any prior children are healthy, doing well, without any problems or issues: no Last pap smear 2019 NIL History of STIs: No   Past Medical History: Past Medical History:  Diagnosis Date  . VP (ventriculoperitoneal) shunt status     Past Surgical History: Past Surgical History:  Procedure Laterality Date  . DG HYSTEROGRAM (HSG)  10/28/2018      .  hydroceph    . SHUNT REVISION VENTRICULAR-PERITONEAL      Family History:  No family history on file. She denies any female cancers, bleeding or blood clotting disorders.   Social History:  Social History   Socioeconomic History  . Marital status: Single    Spouse name: Not on file  . Number of children: Not on file  . Years of education: Not on file  . Highest education level: Not on file  Occupational History  . Not on file  Tobacco Use  . Smoking status: Never Smoker  . Smokeless tobacco: Never Used  Vaping Use  . Vaping Use: Never used  Substance and Sexual Activity  . Alcohol use: Never  . Drug use: No  . Sexual activity: Yes    Birth  control/protection: None  Other Topics Concern  . Not on file  Social History Narrative  . Not on file   Social Determinants of Health   Financial Resource Strain:   . Difficulty of Paying Living Expenses:   Food Insecurity:   . Worried About Programme researcher, broadcasting/film/video in the Last Year:   . Barista in the Last Year:   Transportation Needs:   . Freight forwarder (Medical):   Marland Kitchen Lack of Transportation (Non-Medical):   Physical Activity:   . Days of Exercise per Week:   . Minutes of Exercise per Session:   Stress:   . Feeling of Stress :   Social Connections:   . Frequency of Communication with Friends and Family:   . Frequency of Social Gatherings with Friends and Family:   . Attends Religious Services:   . Active Member of Clubs or Organizations:   . Attends Banker Meetings:   Marland Kitchen Marital Status:   Intimate Partner Violence:   . Fear of Current or Ex-Partner:   . Emotionally Abused:   Marland Kitchen Physically Abused:   . Sexually Abused:     Allergy: No Known Allergies  Current Outpatient Medications:  Current Outpatient Medications:  .  letrozole (FEMARA) 2.5 MG tablet, Take 3 tablets (7.5 mg total) by mouth daily. Take medication on cycle days 3-7, Disp: 15 tablet, Rfl: 5   Physical Exam: Physical Exam Vitals and nursing note reviewed.  HENT:     Head: Normocephalic and atraumatic.  Eyes:     Pupils: Pupils are equal, round, and reactive to light.  Neck:     Thyroid: No thyromegaly.  Cardiovascular:     Rate and Rhythm: Normal rate and regular rhythm.  Pulmonary:     Effort: Pulmonary effort is normal.  Abdominal:     General: Bowel sounds are normal. There is no distension.     Palpations: Abdomen is soft.     Tenderness: There is no abdominal tenderness. There is no guarding or rebound.  Genitourinary:    General: Normal vulva.  Musculoskeletal:        General: Normal range of motion.     Cervical back: Normal range of motion and neck supple.    Skin:    General: Skin is warm and dry.  Neurological:     Mental Status: She is alert and oriented to person, place, and time.  Psychiatric:        Mood and Affect: Affect normal.        Judgment: Judgment normal.     Assessment: Ms. Wieseler is a 34 y.o. G1P0000 Unknown based on Patient's last menstrual period was 05/18/2019. with an Estimated Date  of Delivery: None noted.,  for prenatal care.  Plan:  1) Avoid alcoholic beverages. 2) Patient encouraged not to smoke.  3) Discontinue the use of all non-medicinal drugs and chemicals.  4) Take prenatal vitamins daily.  5) Seatbelt use advised 6) Nutrition, food safety (fish, cheese advisories, and high nitrite foods) and exercise discussed. 7) Hospital and practice style delivering at [facility] discussed  8) Patient is asked about travel to areas at risk for the Zika virus, and counseled to avoid travel and exposure to mosquitoes or sexual partners who may have themselves been exposed to the virus. Testing is discussed, and will be ordered as appropriate.  9) Childbirth classes at [facility] advised 10) Genetic Screening, such as with 1st Trimester Screening, cell free fetal DNA, AFP testing, and Ultrasound, as well as with amniocentesis and CVS as appropriate, is discussed with patient. She plans to have genetic testing this pregnancy.  Cough- advised covid testing. Patient taking Mucinex, discussed other OTC medications available for pregnancy  Problem list reviewed and updated.  I discussed the assessment and treatment plan with the patient. The patient was provided an opportunity to ask questions and all were answered. The patient agreed with the plan and demonstrated an understanding of the instructions.  Adelene Idler MD [facility], [facility] 07/08/2019 2:22 PM  .Marcha Dutton

## 2019-07-09 LAB — RPR+RH+ABO+RUB AB+AB SCR+CB...
Antibody Screen: NEGATIVE
HIV Screen 4th Generation wRfx: NONREACTIVE
Hematocrit: 41.8 % (ref 34.0–46.6)
Hemoglobin: 14 g/dL (ref 11.1–15.9)
Hepatitis B Surface Ag: NEGATIVE
MCH: 29 pg (ref 26.6–33.0)
MCHC: 33.5 g/dL (ref 31.5–35.7)
MCV: 87 fL (ref 79–97)
Platelets: 380 10*3/uL (ref 150–450)
RBC: 4.82 x10E6/uL (ref 3.77–5.28)
RDW: 12.8 % (ref 11.7–15.4)
RPR Ser Ql: NONREACTIVE
Rh Factor: POSITIVE
Rubella Antibodies, IGG: <0.9 index — ABNORMAL LOW (ref >0.99)
Varicella zoster IgG: 696 index (ref >165)
WBC: 7.1 10*3/uL (ref 3.4–10.8)

## 2019-07-12 LAB — CERVICOVAGINAL ANCILLARY ONLY
Chlamydia: NEGATIVE
Comment: NEGATIVE
Comment: NORMAL
Neisseria Gonorrhea: NEGATIVE

## 2019-07-27 ENCOUNTER — Encounter: Payer: Self-pay | Admitting: Obstetrics and Gynecology

## 2019-07-27 ENCOUNTER — Other Ambulatory Visit: Payer: Self-pay

## 2019-07-27 ENCOUNTER — Ambulatory Visit (INDEPENDENT_AMBULATORY_CARE_PROVIDER_SITE_OTHER): Payer: No Typology Code available for payment source | Admitting: Obstetrics and Gynecology

## 2019-07-27 ENCOUNTER — Encounter: Payer: No Typology Code available for payment source | Admitting: Obstetrics and Gynecology

## 2019-07-27 VITALS — BP 120/70 | Ht 64.0 in | Wt 188.2 lb

## 2019-07-27 DIAGNOSIS — Z3401 Encounter for supervision of normal first pregnancy, first trimester: Secondary | ICD-10-CM

## 2019-07-27 DIAGNOSIS — Z1379 Encounter for other screening for genetic and chromosomal anomalies: Secondary | ICD-10-CM

## 2019-07-27 DIAGNOSIS — Z3480 Encounter for supervision of other normal pregnancy, unspecified trimester: Secondary | ICD-10-CM

## 2019-07-27 DIAGNOSIS — Z3A10 10 weeks gestation of pregnancy: Secondary | ICD-10-CM

## 2019-07-27 LAB — POCT URINALYSIS DIPSTICK OB
Glucose, UA: NEGATIVE
POC,PROTEIN,UA: NEGATIVE

## 2019-07-27 NOTE — Progress Notes (Signed)
[id number]

## 2019-07-27 NOTE — Progress Notes (Signed)
    Routine Prenatal Care Visit  Subjective  Vicki Mcdowell is a 35 y.o. G1P0000 at [redacted]w[redacted]d being seen today for ongoing prenatal care.  She is currently monitored for the following issues for this low-risk pregnancy and has Supervision of other normal pregnancy, antepartum on their problem list.  ----------------------------------------------------------------------------------- Patient reports no complaints.   Contractions: Not present. Vag. Bleeding: None.  Movement: Absent. Denies leaking of fluid.  ----------------------------------------------------------------------------------- The following portions of the patient's history were reviewed and updated as appropriate: allergies, current medications, past family history, past medical history, past social history, past surgical history and problem list. Problem list updated.   Objective  Blood pressure 120/70, height 5\' 4"  (1.626 m), weight 188 lb 3.2 oz (85.4 kg), last menstrual period 05/18/2019. Pregravid weight Pregravid weight not on file Total Weight Gain Not found. Urinalysis:      Fetal Status: Fetal Heart Rate (bpm): 160   Movement: Absent     General:  Alert, oriented and cooperative. Patient is in no acute distress.  Skin: Skin is warm and dry. No rash noted.   Cardiovascular: Normal heart rate noted  Respiratory: Normal respiratory effort, no problems with respiration noted  Abdomen: Soft, gravid, appropriate for gestational age. Pain/Pressure: Absent     Pelvic:  Cervical exam deferred        Extremities: Normal range of motion.  Edema: None  Mental Status: Normal mood and affect. Normal behavior. Normal judgment and thought content.     Assessment   34 y.o. G1P0000 at [redacted]w[redacted]d by  02/22/2020, by Last Menstrual Period presenting for routine prenatal visit  Plan   pregnancy 1 Problems (from 07/08/19 to present)    Problem Noted Resolved   Supervision of other normal pregnancy, antepartum 07/08/2019 by 09/07/2019, MD No   Overview Addendum 07/27/2019  2:38 PM by 07/29/2019, MD     Nursing Staff Provider  Office Location  [facility] Dating   LMP = 7wk [name]  Language  English Anatomy US    Flu Vaccine   Genetic Screen  NIPS:   AFP:   First Screen:    TDaP vaccine    Hgb A1C or  GTT Early : Third trimester :   Rhogam   not needed   LAB RESULTS   Feeding Plan  Blood Type A/Positive/-- (06/11 1500)   Contraception  Antibody Negative (06/11 1500)  Circumcision  Rubella <0.90 (06/11 1500)  Pediatrician   RPR Non Reactive (06/11 1500)   Support Person  HBsAg Negative (06/11 1500)   Prenatal Classes  HIV Non Reactive (06/11 1500)    Varicella immune  BTL Consent  GBS  (For PCN allergy, check sensitivities)        VBAC Consent  Pap  2019 NIL    Hgb Electro      CF      SMA               Previous Version     Maternit21 testing today, gender is a surprise  Gestational age appropriate obstetric precautions including but not limited to vaginal bleeding, contractions, leaking of fluid and fetal movement were reviewed in detail with the patient.    Return in about 2 weeks (around 08/10/2019) for ROB visit.  08/12/2019 MD [facility], [facility] 07/27/2019, 2:37 PM

## 2019-07-27 NOTE — Patient Instructions (Signed)
First Trimester of Pregnancy The first trimester of pregnancy is from week 1 until the end of week 13 (months 1 through 3). A week after a sperm fertilizes an egg, the egg will implant on the wall of the uterus. This embryo will begin to develop into a baby. Genes from you and your partner will form the baby. The female genes will determine whether the baby will be a boy or a girl. At 6-8 weeks, the eyes and face will be formed, and the heartbeat can be seen on ultrasound. At the end of 12 weeks, all the baby's organs will be formed. Now that you are pregnant, you will want to do everything you can to have a healthy baby. Two of the most important things are to get good prenatal care and to follow your health care provider's instructions. Prenatal care is all the medical care you receive before the baby's birth. This care will help prevent, find, and treat any problems during the pregnancy and childbirth. Body changes during your first trimester Your body goes through many changes during pregnancy. The changes vary from woman to woman.  You may gain or lose a couple of pounds at first.  You may feel sick to your stomach (nauseous) and you may throw up (vomit). If the vomiting is uncontrollable, call your health care provider.  You may tire easily.  You may develop headaches that can be relieved by medicines. All medicines should be approved by your health care provider.  You may urinate more often. Painful urination may mean you have a bladder infection.  You may develop heartburn as a result of your pregnancy.  You may develop constipation because certain hormones are causing the muscles that push stool through your intestines to slow down.  You may develop hemorrhoids or swollen veins (varicose veins).  Your breasts may begin to grow larger and become tender. Your nipples may stick out more, and the tissue that surrounds them (areola) may become darker.  Your gums may bleed and may be  sensitive to brushing and flossing.  Dark spots or blotches (chloasma, mask of pregnancy) may develop on your face. This will likely fade after the baby is born.  Your menstrual periods will stop.  You may have a loss of appetite.  You may develop cravings for certain kinds of food.  You may have changes in your emotions from day to day, such as being excited to be pregnant or being concerned that something may go wrong with the pregnancy and baby.  You may have more vivid and strange dreams.  You may have changes in your hair. These can include thickening of your hair, rapid growth, and changes in texture. Some women also have hair loss during or after pregnancy, or hair that feels dry or thin. Your hair will most likely return to normal after your baby is born. What to expect at prenatal visits During a routine prenatal visit:  You will be weighed to make sure you and the baby are growing normally.  Your blood pressure will be taken.  Your abdomen will be measured to track your baby's growth.  The fetal heartbeat will be listened to between weeks 10 and 14 of your pregnancy.  Test results from any previous visits will be discussed. Your health care provider may ask you:  How you are feeling.  If you are feeling the baby move.  If you have had any abnormal symptoms, such as leaking fluid, bleeding, severe headaches, or abdominal   cramping.  If you are using any tobacco products, including cigarettes, chewing tobacco, and electronic cigarettes.  If you have any questions. Other tests that may be performed during your first trimester include:  Blood tests to find your blood type and to check for the presence of any previous infections. The tests will also be used to check for low iron levels (anemia) and protein on red blood cells (Rh antibodies). Depending on your risk factors, or if you previously had diabetes during pregnancy, you may have tests to check for high blood sugar  that affects pregnant women (gestational diabetes).  Urine tests to check for infections, diabetes, or protein in the urine.  An ultrasound to confirm the proper growth and development of the baby.  Fetal screens for spinal cord problems (spina bifida) and Down syndrome.  HIV (human immunodeficiency virus) testing. Routine prenatal testing includes screening for HIV, unless you choose not to have this test.  You may need other tests to make sure you and the baby are doing well. Follow these instructions at home: Medicines  Follow your health care provider's instructions regarding medicine use. Specific medicines may be either safe or unsafe to take during pregnancy.  Take a prenatal vitamin that contains at least 600 micrograms (mcg) of folic acid.  If you develop constipation, try taking a stool softener if your health care provider approves. Eating and drinking   Eat a balanced diet that includes fresh fruits and vegetables, whole grains, good sources of protein such as meat, eggs, or tofu, and low-fat dairy. Your health care provider will help you determine the amount of weight gain that is right for you.  Avoid raw meat and uncooked cheese. These carry germs that can cause birth defects in the baby.  Eating four or five small meals rather than three large meals a day may help relieve nausea and vomiting. If you start to feel nauseous, eating a few soda crackers can be helpful. Drinking liquids between meals, instead of during meals, also seems to help ease nausea and vomiting.  Limit foods that are high in fat and processed sugars, such as fried and sweet foods.  To prevent constipation: ? Eat foods that are high in fiber, such as fresh fruits and vegetables, whole grains, and beans. ? Drink enough fluid to keep your urine clear or pale yellow. Activity  Exercise only as directed by your health care provider. Most women can continue their usual exercise routine during  pregnancy. Try to exercise for 30 minutes at least 5 days a week. Exercising will help you: ? Control your weight. ? Stay in shape. ? Be prepared for labor and delivery.  Experiencing pain or cramping in the lower abdomen or lower back is a good sign that you should stop exercising. Check with your health care provider before continuing with normal exercises.  Try to avoid standing for long periods of time. Move your legs often if you must stand in one place for a long time.  Avoid heavy lifting.  Wear low-heeled shoes and practice good posture.  You may continue to have sex unless your health care provider tells you not to. Relieving pain and discomfort  Wear a good support bra to relieve breast tenderness.  Take warm sitz baths to soothe any pain or discomfort caused by hemorrhoids. Use hemorrhoid cream if your health care provider approves.  Rest with your legs elevated if you have leg cramps or low back pain.  If you develop varicose veins in   your legs, wear support hose. Elevate your feet for 15 minutes, 3-4 times a day. Limit salt in your diet. Prenatal care  Schedule your prenatal visits by the twelfth week of pregnancy. They are usually scheduled monthly at first, then more often in the last 2 months before delivery.  Write down your questions. Take them to your prenatal visits.  Keep all your prenatal visits as told by your health care provider. This is important. Safety  Wear your seat belt at all times when driving.  Make a list of emergency phone numbers, including numbers for family, friends, the hospital, and police and fire departments. General instructions  Ask your health care provider for a referral to a local prenatal education class. Begin classes no later than the beginning of month 6 of your pregnancy.  Ask for help if you have counseling or nutritional needs during pregnancy. Your health care provider can offer advice or refer you to specialists for help  with various needs.  Do not use hot tubs, steam rooms, or saunas.  Do not douche or use tampons or scented sanitary pads.  Do not cross your legs for long periods of time.  Avoid cat litter boxes and soil used by cats. These carry germs that can cause birth defects in the baby and possibly loss of the fetus by miscarriage or stillbirth.  Avoid all smoking, herbs, alcohol, and medicines not prescribed by your health care provider. Chemicals in these products affect the formation and growth of the baby.  Do not use any products that contain nicotine or tobacco, such as cigarettes and e-cigarettes. If you need help quitting, ask your health care provider. You may receive counseling support and other resources to help you quit.  Schedule a dentist appointment. At home, brush your teeth with a soft toothbrush and be gentle when you floss. Contact a health care provider if:  You have dizziness.  You have mild pelvic cramps, pelvic pressure, or nagging pain in the abdominal area.  You have persistent nausea, vomiting, or diarrhea.  You have a bad smelling vaginal discharge.  You have pain when you urinate.  You notice increased swelling in your face, hands, legs, or ankles.  You are exposed to fifth disease or chickenpox.  You are exposed to German measles (rubella) and have never had it. Get help right away if:  You have a fever.  You are leaking fluid from your vagina.  You have spotting or bleeding from your vagina.  You have severe abdominal cramping or pain.  You have rapid weight gain or loss.  You vomit blood or material that looks like coffee grounds.  You develop a severe headache.  You have shortness of breath.  You have any kind of trauma, such as from a fall or a car accident. Summary  The first trimester of pregnancy is from week 1 until the end of week 13 (months 1 through 3).  Your body goes through many changes during pregnancy. The changes vary from  woman to woman.  You will have routine prenatal visits. During those visits, your health care provider will examine you, discuss any test results you may have, and talk with you about how you are feeling. This information is not intended to replace advice given to you by your health care provider. Make sure you discuss any questions you have with your health care provider. Document Revised: 12/26/2016 Document Reviewed: 12/26/2015 Elsevier Patient Education  2020 Elsevier Inc.  

## 2019-07-29 LAB — MONITOR DRUG PROFILE 10(MW)
Amphetamine Scrn, Ur: NEGATIVE ng/mL
BARBITURATE SCREEN URINE: NEGATIVE ng/mL
BENZODIAZEPINE SCREEN, URINE: NEGATIVE ng/mL
CANNABINOIDS UR QL SCN: NEGATIVE ng/mL
Cocaine (Metab) Scrn, Ur: NEGATIVE ng/mL
Creatinine(Crt), U: 16 mg/dL — ABNORMAL LOW (ref 20.0–300.0)
Methadone Screen, Urine: NEGATIVE ng/mL
OXYCODONE+OXYMORPHONE UR QL SCN: NEGATIVE ng/mL
Opiate Scrn, Ur: NEGATIVE ng/mL
Ph of Urine: 6.5 (ref 4.5–8.9)
Phencyclidine Qn, Ur: NEGATIVE ng/mL
Propoxyphene Scrn, Ur: NEGATIVE ng/mL

## 2019-07-29 LAB — URINE CULTURE

## 2019-07-29 LAB — SPECIFIC GRAVITY (REFLEXED): SPECIFIC GRAVITY: 1.0025

## 2019-08-02 LAB — MATERNIT21 PLUS CORE+SCA
Fetal Fraction: 9
Monosomy X (Turner Syndrome): NOT DETECTED
Result (T21): NEGATIVE
Trisomy 13 (Patau syndrome): NEGATIVE
Trisomy 18 (Edwards syndrome): NEGATIVE
Trisomy 21 (Down syndrome): NEGATIVE
XXX (Triple X Syndrome): NOT DETECTED
XXY (Klinefelter Syndrome): NOT DETECTED
XYY (Jacobs Syndrome): NOT DETECTED

## 2019-08-10 ENCOUNTER — Encounter: Payer: Self-pay | Admitting: Obstetrics & Gynecology

## 2019-08-10 ENCOUNTER — Ambulatory Visit (INDEPENDENT_AMBULATORY_CARE_PROVIDER_SITE_OTHER): Payer: No Typology Code available for payment source | Admitting: Obstetrics & Gynecology

## 2019-08-10 ENCOUNTER — Other Ambulatory Visit: Payer: Self-pay

## 2019-08-10 VITALS — BP 120/70 | Wt 187.0 lb

## 2019-08-10 DIAGNOSIS — Z3401 Encounter for supervision of normal first pregnancy, first trimester: Secondary | ICD-10-CM

## 2019-08-10 DIAGNOSIS — Z3A12 12 weeks gestation of pregnancy: Secondary | ICD-10-CM

## 2019-08-10 MED ORDER — ONDANSETRON 4 MG PO TBDP
4.0000 mg | ORAL_TABLET | Freq: Four times a day (QID) | ORAL | 0 refills | Status: DC | PRN
Start: 2019-08-10 — End: 2019-09-12

## 2019-08-10 NOTE — Patient Instructions (Addendum)
Thank you for choosing [facility]. As part of our ongoing efforts to improve patient experience, we would appreciate your feedback. Please fill out the short survey that you will receive by mail or MyChart. Your opinion is important to [place]! -Dr Tiburcio Pea  Ondansetron oral dissolving tablet What is this medicine? ONDANSETRON (on DAN se tron) is used to treat nausea and vomiting caused by chemotherapy. It is also used to prevent or treat nausea and vomiting after surgery. This medicine may be used for other purposes; ask your health care provider or pharmacist if you have questions. COMMON BRAND NAME(S): Zofran ODT What should I tell my health care provider before I take this medicine? They need to know if you have any of these conditions:  heart disease  history of irregular heartbeat  liver disease  low levels of magnesium or potassium in the blood  an unusual or allergic reaction to ondansetron, granisetron, other medicines, foods, dyes, or preservatives  pregnant or trying to get pregnant  breast-feeding How should I use this medicine? These tablets are made to dissolve in the mouth. Do not try to push the tablet through the foil backing. With dry hands, peel away the foil backing and gently remove the tablet. Place the tablet in the mouth and allow it to dissolve, then swallow. While you may take these tablets with water, it is not necessary to do so. Talk to your pediatrician regarding the use of this medicine in children. Special care may be needed. Overdosage: If you think you have taken too much of this medicine contact a poison control center or emergency room at once. NOTE: This medicine is only for you. Do not share this medicine with others. What if I miss a dose? If you miss a dose, take it as soon as you can. If it is almost time for your next dose, take only that dose. Do not take double or extra doses. What may interact with this medicine? Do not take this medicine with  any of the following medications:  apomorphine  certain medicines for fungal infections like fluconazole, itraconazole, ketoconazole, posaconazole, voriconazole  cisapride  dronedarone  pimozide  thioridazine This medicine may also interact with the following medications:  carbamazepine  certain medicines for depression, anxiety, or psychotic disturbances  fentanyl  linezolid  MAOIs like Carbex, Eldepryl, Marplan, Nardil, and Parnate  methylene blue (injected into a vein)  other medicines that prolong the QT interval (cause an abnormal heart rhythm) like dofetilide, ziprasidone  phenytoin  rifampicin  tramadol This list may not describe all possible interactions. Give your health care provider a list of all the medicines, herbs, non-prescription drugs, or dietary supplements you use. Also tell them if you smoke, drink alcohol, or use illegal drugs. Some items may interact with your medicine. What should I watch for while using this medicine? Check with your doctor or health care professional as soon as you can if you have any sign of an allergic reaction. What side effects may I notice from receiving this medicine? Side effects that you should report to your doctor or health care professional as soon as possible:  allergic reactions like skin rash, itching or hives, swelling of the face, lips, or tongue  breathing problems  confusion  dizziness  fast or irregular heartbeat  feeling faint or lightheaded, falls  fever and chills  loss of balance or coordination  seizures  sweating  swelling of the hands and feet  tightness in the chest  tremors  unusually weak or tired Side effects that usually do not require medical attention (report to your doctor or health care professional if they continue or are bothersome):  constipation or diarrhea  headache This list may not describe all possible side effects. Call your doctor for medical advice about side  effects. You may report side effects to FDA at 1-800-FDA-1088. Where should I keep my medicine? Keep out of the reach of children. Store between 2 and 30 degrees C (36 and 86 degrees F). Throw away any unused medicine after the expiration date. NOTE: This sheet is a summary. It may not cover all possible information. If you have questions about this medicine, talk to your doctor, pharmacist, or health care provider.  2020 Elsevier/Gold Standard (2018-01-05 07:14:10)

## 2019-08-10 NOTE — Progress Notes (Signed)
  Subjective  Nausea? Yes, Occasional Vaginal Bleeding? no  Objective  BP 120/70   Wt 187 lb (84.8 kg)   LMP 05/18/2019   BMI 32.10 kg/m  General: NAD Pumonary: no increased work of breathing Abdomen: gravid, non-tender Extremities: no edema Psychiatric: mood appropriate, affect full  Assessment  34 y.o. G1P0000 at [redacted]w[redacted]d by  02/22/2020, by Last Menstrual Period presenting for routine prenatal visit  Plan   Problem List Items Addressed This Visit    None    Visit Diagnoses    [redacted] weeks gestation of pregnancy    -  Primary   Encounter for supervision of normal first pregnancy in first trimester          pregnancy 1 Problems (from 07/08/19 to present)    Problem Noted Resolved   Supervision of other normal pregnancy, antepartum 07/08/2019 by [name], MD No   Overview Addendum 07/27/2019  2:39 PM by [name], MD     Nursing Staff Provider  Office Location  [facility] Dating   LMP = 7wk [place]  Language  English Anatomy US    Flu Vaccine   Genetic Screen  NIPS:   AFP:   First Screen:    TDaP vaccine    Hgb A1C or  GTT Early : Third trimester :   Rhogam   not needed   LAB RESULTS   Feeding Plan  Blood Type A/Positive/-- (06/11 1500)   Contraception  Antibody Negative (06/11 1500)  Circumcision  Rubella <0.90 (06/11 1500)  Pediatrician   RPR Non Reactive (06/11 1500)   Support Person  HBsAg Negative (06/11 1500)   Prenatal Classes  HIV Non Reactive (06/11 1500)    Varicella immune   BTL Consent  GBS  (For PCN allergy, check sensitivities)        VBAC Consent  Pap  2019 NIL    Hgb Electro      CF      SMA               Previous Version    PNV Zofran for nausea   Annamarie Major, MD, [facility], [facility] 08/10/2019  2:34 PM

## 2019-09-09 ENCOUNTER — Encounter: Payer: No Typology Code available for payment source | Admitting: Obstetrics and Gynecology

## 2019-09-12 ENCOUNTER — Ambulatory Visit (INDEPENDENT_AMBULATORY_CARE_PROVIDER_SITE_OTHER): Payer: No Typology Code available for payment source | Admitting: Obstetrics and Gynecology

## 2019-09-12 ENCOUNTER — Other Ambulatory Visit: Payer: Self-pay

## 2019-09-12 ENCOUNTER — Encounter: Payer: Self-pay | Admitting: Obstetrics and Gynecology

## 2019-09-12 VITALS — BP 110/70 | Ht 64.0 in | Wt 190.0 lb

## 2019-09-12 DIAGNOSIS — Z3482 Encounter for supervision of other normal pregnancy, second trimester: Secondary | ICD-10-CM

## 2019-09-12 DIAGNOSIS — Z3480 Encounter for supervision of other normal pregnancy, unspecified trimester: Secondary | ICD-10-CM

## 2019-09-12 DIAGNOSIS — Z3A16 16 weeks gestation of pregnancy: Secondary | ICD-10-CM

## 2019-09-12 LAB — POCT URINALYSIS DIPSTICK OB
Glucose, UA: NEGATIVE
POC,PROTEIN,UA: NEGATIVE

## 2019-09-12 MED ORDER — ONDANSETRON 4 MG PO TBDP: 4.0000 mg | ORAL_TABLET | Freq: Four times a day (QID) | ORAL | 3 refills | Status: DC | PRN

## 2019-09-12 NOTE — Progress Notes (Signed)
    Routine Prenatal Care Visit  Subjective  Vicki Mcdowell is a 34 y.o. G1P0000 at [redacted]w[redacted]d being seen today for ongoing prenatal care.  She is currently monitored for the following issues for this low-risk pregnancy and has Supervision of other normal pregnancy, antepartum on their problem list.  ----------------------------------------------------------------------------------- Patient reports no complaints.   Contractions: Not present. Vag. Bleeding: None.  Movement: Absent. Denies leaking of fluid.  ----------------------------------------------------------------------------------- The following portions of the patient's history were reviewed and updated as appropriate: allergies, current medications, past family history, past medical history, past social history, past surgical history and problem list. Problem list updated.   Objective  Blood pressure 110/70, height 5\' 4"  (1.626 m), weight 190 lb (86.2 kg), last menstrual period 05/18/2019. Pregravid weight 189 lb (85.7 kg) Total Weight Gain 1 lb (0.454 kg) Urinalysis:      Fetal Status: Fetal Heart Rate (bpm): 165   Movement: Absent     General:  Alert, oriented and cooperative. Patient is in no acute distress.  Skin: Skin is warm and dry. No rash noted.   Cardiovascular: Normal heart rate noted  Respiratory: Normal respiratory effort, no problems with respiration noted  Abdomen: Soft, gravid, appropriate for gestational age. Pain/Pressure: Absent     Pelvic:  Cervical exam deferred        Extremities: Normal range of motion.     Mental Status: Normal mood and affect. Normal behavior. Normal judgment and thought content.     Assessment   34 y.o. G1P0000 at [redacted]w[redacted]d by  02/22/2020, by Last Menstrual Period presenting for routine prenatal visit  Plan   pregnancy 1 Problems (from 07/08/19 to present)    Problem Noted Resolved   Supervision of other normal pregnancy, antepartum 07/08/2019 by 09/07/2019, MD No   Overview  Addendum 09/12/2019  1:49 PM by 09/14/2019, MD     Nursing Staff Provider  Office Location  [facility] Dating   LMP = 7wk [name]  Language  English Anatomy US    Flu Vaccine   Genetic Screen  NIPS:  Normal gender surprise  TDaP vaccine    Hgb A1C or  GTT Early : Third trimester :   Rhogam   not needed   LAB RESULTS   Feeding Plan  Blood Type A/Positive/-- (06/11 1500)   Contraception  Antibody Negative (06/11 1500)  Circumcision  Rubella <0.90 (06/11 1500)  Pediatrician   RPR Non Reactive (06/11 1500)   Support Person  HBsAg Negative (06/11 1500)   Prenatal Classes  discussed HIV Non Reactive (06/11 1500)    Varicella immune   BTL Consent  GBS  (For PCN allergy, check sensitivities)        VBAC Consent  Pap  2019 NIL    Hgb Electro      CF      SMA               Previous Version      Anatomy 2020 next visit Encouraged COVID 19 vaccination  Gestational age appropriate obstetric precautions including but not limited to vaginal bleeding, contractions, leaking of fluid and fetal movement were reviewed in detail with the patient.    Return in about 3 weeks (around 10/03/2019) for ROB and 12/03/2019 anatomy.  [place] MD [facility], [facility] 09/12/2019, 1:49 PM

## 2019-09-12 NOTE — Patient Instructions (Signed)

## 2019-10-06 ENCOUNTER — Encounter: Payer: Self-pay | Admitting: Obstetrics and Gynecology

## 2019-10-06 ENCOUNTER — Ambulatory Visit (INDEPENDENT_AMBULATORY_CARE_PROVIDER_SITE_OTHER): Payer: No Typology Code available for payment source

## 2019-10-06 ENCOUNTER — Other Ambulatory Visit: Payer: Self-pay | Admitting: Obstetrics and Gynecology

## 2019-10-06 ENCOUNTER — Other Ambulatory Visit: Payer: Self-pay

## 2019-10-06 ENCOUNTER — Ambulatory Visit (INDEPENDENT_AMBULATORY_CARE_PROVIDER_SITE_OTHER): Payer: No Typology Code available for payment source | Admitting: Obstetrics and Gynecology

## 2019-10-06 VITALS — BP 118/70 | Ht 64.0 in | Wt 190.6 lb

## 2019-10-06 DIAGNOSIS — Z3A20 20 weeks gestation of pregnancy: Secondary | ICD-10-CM

## 2019-10-06 DIAGNOSIS — Z3689 Encounter for other specified antenatal screening: Secondary | ICD-10-CM

## 2019-10-06 DIAGNOSIS — Z3480 Encounter for supervision of other normal pregnancy, unspecified trimester: Secondary | ICD-10-CM

## 2019-10-06 DIAGNOSIS — Z3686 Encounter for antenatal screening for cervical length: Secondary | ICD-10-CM

## 2019-10-06 LAB — POCT URINALYSIS DIPSTICK OB
Glucose, UA: NEGATIVE
POC,PROTEIN,UA: NEGATIVE

## 2019-10-06 NOTE — Progress Notes (Signed)
    Routine Prenatal Care Visit  Subjective  Vicki Mcdowell is a 34 y.o. G1P0000 at [redacted]w[redacted]d being seen today for ongoing prenatal care.  She is currently monitored for the following issues for this low-risk pregnancy and has Supervision of other normal pregnancy, antepartum on their problem list.  ----------------------------------------------------------------------------------- Patient reports no complaints.   Contractions: Not present. Vag. Bleeding: None.  Movement: Present. Denies leaking of fluid.  ----------------------------------------------------------------------------------- The following portions of the patient's history were reviewed and updated as appropriate: allergies, current medications, past family history, past medical history, past social history, past surgical history and problem list. Problem list updated.   Objective  Blood pressure 118/70, height 5\' 4"  (1.626 m), weight 190 lb 9.6 oz (86.5 kg), last menstrual period 05/18/2019. Pregravid weight 189 lb (85.7 kg) Total Weight Gain 1 lb 9.6 oz (0.726 kg) Urinalysis:      Fetal Status: Fetal Heart Rate (bpm): 145   Movement: Present     General:  Alert, oriented and cooperative. Patient is in no acute distress.  Skin: Skin is warm and dry. No rash noted.   Cardiovascular: Normal heart rate noted  Respiratory: Normal respiratory effort, no problems with respiration noted  Abdomen: Soft, gravid, appropriate for gestational age. Pain/Pressure: Absent     Pelvic:  Cervical exam deferred        Extremities: Normal range of motion.     Mental Status: Normal mood and affect. Normal behavior. Normal judgment and thought content.     Assessment   34 y.o. G1P0000 at [redacted]w[redacted]d by  02/22/2020, by Last Menstrual Period presenting for routine prenatal visit  Plan   pregnancy 1 Problems (from 07/08/19 to present)    Problem Noted Resolved   Supervision of other normal pregnancy, antepartum 07/08/2019 by 09/07/2019,  MD No   Overview Addendum 10/06/2019  2:39 PM by 12/06/2019, MD     Nursing Staff Provider  Office Location  [facility] Dating   LMP = 7wk [name]  Language  English Anatomy US   incomplete  Flu Vaccine   Genetic Screen  NIPS:  Normal gender surprise  TDaP vaccine    Hgb A1C or  GTT Early : Third trimester :   Rhogam   not needed   LAB RESULTS   Feeding Plan  Blood Type A/Positive/-- (06/11 1500)   Contraception  Antibody Negative (06/11 1500)  Circumcision  Rubella <0.90 (06/11 1500)  Pediatrician   RPR Non Reactive (06/11 1500)   Support Person  [name] HBsAg Negative (06/11 1500)   Prenatal Classes  discussed HIV Non Reactive (06/11 1500)    Varicella immune   BTL Consent  GBS  (For PCN allergy, check sensitivities)        VBAC Consent  Pap  2019 NIL    Hgb Electro      CF      SMA               Previous Version      Incomplete anatomy 2020, follow up in 2 weeks Discussed flu vaccination- declines today, will consider for future  Gestational age appropriate obstetric precautions including but not limited to vaginal bleeding, contractions, leaking of fluid and fetal movement were reviewed in detail with the patient.    Return in about 2 weeks (around 10/20/2019) for ROB and follow up anatomy 10/22/2019.  [place] MD [facility], [facility] 10/06/2019, 2:40 PM

## 2019-10-06 NOTE — Addendum Note (Signed)
Addended by: Clement Husbands A on: 10/06/2019 04:36 PM   Modules accepted: Orders

## 2019-10-06 NOTE — Patient Instructions (Signed)

## 2019-10-20 ENCOUNTER — Ambulatory Visit (INDEPENDENT_AMBULATORY_CARE_PROVIDER_SITE_OTHER): Payer: No Typology Code available for payment source | Admitting: Obstetrics & Gynecology

## 2019-10-20 ENCOUNTER — Encounter: Payer: Self-pay | Admitting: Obstetrics & Gynecology

## 2019-10-20 ENCOUNTER — Other Ambulatory Visit: Payer: Self-pay

## 2019-10-20 ENCOUNTER — Ambulatory Visit (INDEPENDENT_AMBULATORY_CARE_PROVIDER_SITE_OTHER): Payer: No Typology Code available for payment source

## 2019-10-20 VITALS — BP 120/80 | Wt 192.0 lb

## 2019-10-20 DIAGNOSIS — Z3482 Encounter for supervision of other normal pregnancy, second trimester: Secondary | ICD-10-CM

## 2019-10-20 DIAGNOSIS — Z3A22 22 weeks gestation of pregnancy: Secondary | ICD-10-CM

## 2019-10-20 DIAGNOSIS — Z3480 Encounter for supervision of other normal pregnancy, unspecified trimester: Secondary | ICD-10-CM

## 2019-10-20 DIAGNOSIS — Z131 Encounter for screening for diabetes mellitus: Secondary | ICD-10-CM

## 2019-10-20 NOTE — Progress Notes (Signed)
  Subjective  Fetal Movement? yes Contractions? no Leaking Fluid? no Vaginal Bleeding? no  Objective  BP 120/80   Wt 192 lb (87.1 kg)   LMP 05/18/2019   BMI 32.96 kg/m  General: NAD Pumonary: no increased work of breathing Abdomen: gravid, non-tender Extremities: no edema Psychiatric: mood appropriate, affect full  Assessment  34 y.o. G1P0000 at [redacted]w[redacted]d by  02/22/2020, by Last Menstrual Period presenting for routine prenatal visit  Plan   Problem List Items Addressed This Visit      Other   Supervision of other normal pregnancy, antepartum    Other Visit Diagnoses    Screening for diabetes mellitus    -  Primary   Relevant Orders   28 Week RH+Panel   [redacted] weeks gestation of pregnancy          pregnancy 1 Problems (from 07/08/19 to present)    Problem Noted Resolved   Supervision of other normal pregnancy, antepartum 07/08/2019 by [name], MD No   Overview Addendum 10/20/2019  1:30 PM by Nadara Mustard, MD     Nursing Staff Provider  Office Location  [facility] Dating   LMP = 7wk [place]  Language  English Anatomy US   incomplete  Flu Vaccine   Genetic Screen  NIPS:  Normal gender surprise  TDaP vaccine    Hgb A1C or  GTT Third trimester :   Rhogam   not needed   LAB RESULTS   Feeding Plan  Bottle Blood Type A/Positive/-- (06/11 1500)   Contraception  OCP Antibody Negative (06/11 1500)  Circumcision  Rubella <0.90 (06/11 1500)  Pediatrician   RPR Non Reactive (06/11 1500)   Support Person  [name] HBsAg Negative (06/11 1500)   Prenatal Classes  discussed HIV Non Reactive (06/11 1500)    Varicella immune   BTL Consent no GBS  (For PCN allergy, check sensitivities)        VBAC Consent n/a Pap  2019 NIL         Review of ULTRASOUND. I have personally reviewed images and report of recent ultrasound done at [facility]. There is a singleton gestation with subjectively normal amniotic fluid volume. The fetal biometry correlates with established dating. Detailed  evaluation of the fetal anatomy was performed.The fetal anatomical survey appears within normal limits within the resolution of ultrasound as described above.  It must be noted that a normal ultrasound is unable to rule out fetal aneuploidy.      PNV   Glucola sch appt 6 weeks   Desires to bottle feed; options for breast feeding discussed   Desires OCP for birth control     Annamarie Major, MD, [facility], [facility] 10/20/2019  1:30 PM

## 2019-10-20 NOTE — Patient Instructions (Signed)

## 2019-11-03 ENCOUNTER — Encounter: Payer: Self-pay | Admitting: Obstetrics and Gynecology

## 2019-11-03 ENCOUNTER — Ambulatory Visit (INDEPENDENT_AMBULATORY_CARE_PROVIDER_SITE_OTHER): Payer: No Typology Code available for payment source | Admitting: Obstetrics and Gynecology

## 2019-11-03 ENCOUNTER — Other Ambulatory Visit: Payer: Self-pay

## 2019-11-03 VITALS — BP 118/62 | Wt 192.0 lb

## 2019-11-03 DIAGNOSIS — Z3480 Encounter for supervision of other normal pregnancy, unspecified trimester: Secondary | ICD-10-CM

## 2019-11-03 DIAGNOSIS — O99210 Obesity complicating pregnancy, unspecified trimester: Secondary | ICD-10-CM

## 2019-11-03 DIAGNOSIS — Z3A24 24 weeks gestation of pregnancy: Secondary | ICD-10-CM

## 2019-11-03 DIAGNOSIS — O26899 Other specified pregnancy related conditions, unspecified trimester: Secondary | ICD-10-CM

## 2019-11-03 DIAGNOSIS — G5600 Carpal tunnel syndrome, unspecified upper limb: Secondary | ICD-10-CM

## 2019-11-03 LAB — POCT URINALYSIS DIPSTICK OB
Glucose, UA: NEGATIVE
POC,PROTEIN,UA: NEGATIVE

## 2019-11-03 NOTE — Patient Instructions (Addendum)
Second Trimester of Pregnancy The second trimester is from week 14 through week 27 (months 4 through 6). The second trimester is often a time when you feel your best. Your body has adjusted to being pregnant, and you begin to feel better physically. Usually, morning sickness has lessened or quit completely, you may have more energy, and you may have an increase in appetite. The second trimester is also a time when the fetus is growing rapidly. At the end of the sixth month, the fetus is about 9 inches long and weighs about 1 pounds. You will likely begin to feel the baby move (quickening) between 16 and 20 weeks of pregnancy. Body changes during your second trimester Your body continues to go through many changes during your second trimester. The changes vary from woman to woman.  Your weight will continue to increase. You will notice your lower abdomen bulging out.  You may begin to get stretch marks on your hips, abdomen, and breasts.  You may develop headaches that can be relieved by medicines. The medicines should be approved by your health care provider.  You may urinate more often because the fetus is pressing on your bladder.  You may develop or continue to have heartburn as a result of your pregnancy.  You may develop constipation because certain hormones are causing the muscles that push waste through your intestines to slow down.  You may develop hemorrhoids or swollen, bulging veins (varicose veins).  You may have back pain. This is caused by: ? Weight gain. ? Pregnancy hormones that are relaxing the joints in your pelvis. ? A shift in weight and the muscles that support your balance.  Your breasts will continue to grow and they will continue to become tender.  Your gums may bleed and may be sensitive to brushing and flossing.  Dark spots or blotches (chloasma, mask of pregnancy) may develop on your face. This will likely fade after the baby is born.  A dark line from your  belly button to the pubic area (linea nigra) may appear. This will likely fade after the baby is born.  You may have changes in your hair. These can include thickening of your hair, rapid growth, and changes in texture. Some women also have hair loss during or after pregnancy, or hair that feels dry or thin. Your hair will most likely return to normal after your baby is born. What to expect at prenatal visits During a routine prenatal visit:  You will be weighed to make sure you and the fetus are growing normally.  Your blood pressure will be taken.  Your abdomen will be measured to track your baby's growth.  The fetal heartbeat will be listened to.  Any test results from the previous visit will be discussed. Your health care provider may ask you:  How you are feeling.  If you are feeling the baby move.  If you have had any abnormal symptoms, such as leaking fluid, bleeding, severe headaches, or abdominal cramping.  If you are using any tobacco products, including cigarettes, chewing tobacco, and electronic cigarettes.  If you have any questions. Other tests that may be performed during your second trimester include:  Blood tests that check for: ? Low iron levels (anemia). ? High blood sugar that affects pregnant women (gestational diabetes) between 24 and 28 weeks. ? Rh antibodies. This is to check for a protein on red blood cells (Rh factor).  Urine tests to check for infections, diabetes, or protein in the   urine.  An ultrasound to confirm the proper growth and development of the baby.  An amniocentesis to check for possible genetic problems.  Fetal screens for spina bifida and Down syndrome.  HIV (human immunodeficiency virus) testing. Routine prenatal testing includes screening for HIV, unless you choose not to have this test. Follow these instructions at home: Medicines  Follow your health care provider's instructions regarding medicine use. Specific medicines may be  either safe or unsafe to take during pregnancy.  Take a prenatal vitamin that contains at least 600 micrograms (mcg) of folic acid.  If you develop constipation, try taking a stool softener if your health care provider approves. Eating and drinking   Eat a balanced diet that includes fresh fruits and vegetables, whole grains, good sources of protein such as meat, eggs, or tofu, and low-fat dairy. Your health care provider will help you determine the amount of weight gain that is right for you.  Avoid raw meat and uncooked cheese. These carry germs that can cause birth defects in the baby.  If you have low calcium intake from food, talk to your health care provider about whether you should take a daily calcium supplement.  Limit foods that are high in fat and processed sugars, such as fried and sweet foods.  To prevent constipation: ? Drink enough fluid to keep your urine clear or pale yellow. ? Eat foods that are high in fiber, such as fresh fruits and vegetables, whole grains, and beans. Activity  Exercise only as directed by your health care provider. Most women can continue their usual exercise routine during pregnancy. Try to exercise for 30 minutes at least 5 days a week. Stop exercising if you experience uterine contractions.  Avoid heavy lifting, wear low heel shoes, and practice good posture.  A sexual relationship may be continued unless your health care provider directs you otherwise. Relieving pain and discomfort  Wear a good support bra to prevent discomfort from breast tenderness.  Take warm sitz baths to soothe any pain or discomfort caused by hemorrhoids. Use hemorrhoid cream if your health care provider approves.  Rest with your legs elevated if you have leg cramps or low back pain.  If you develop varicose veins, wear support hose. Elevate your feet for 15 minutes, 3-4 times a day. Limit salt in your diet. Prenatal Care  Write down your questions. Take them to  your prenatal visits.  Keep all your prenatal visits as told by your health care provider. This is important. Safety  Wear your seat belt at all times when driving.  Make a list of emergency phone numbers, including numbers for family, friends, the hospital, and police and fire departments. General instructions  Ask your health care provider for a referral to a local prenatal education class. Begin classes no later than the beginning of month 6 of your pregnancy.  Ask for help if you have counseling or nutritional needs during pregnancy. Your health care provider can offer advice or refer you to specialists for help with various needs.  Do not use hot tubs, steam rooms, or saunas.  Do not douche or use tampons or scented sanitary pads.  Do not cross your legs for long periods of time.  Avoid cat litter boxes and soil used by cats. These carry germs that can cause birth defects in the baby and possibly loss of the fetus by miscarriage or stillbirth.  Avoid all smoking, herbs, alcohol, and unprescribed drugs. Chemicals in these products can affect the formation   and growth of the baby.  Do not use any products that contain nicotine or tobacco, such as cigarettes and e-cigarettes. If you need help quitting, ask your health care provider.  Visit your dentist if you have not gone yet during your pregnancy. Use a soft toothbrush to brush your teeth and be gentle when you floss. Contact a health care provider if:  You have dizziness.  You have mild pelvic cramps, pelvic pressure, or nagging pain in the abdominal area.  You have persistent nausea, vomiting, or diarrhea.  You have a bad smelling vaginal discharge.  You have pain when you urinate. Get help right away if:  You have a fever.  You are leaking fluid from your vagina.  You have spotting or bleeding from your vagina.  You have severe abdominal cramping or pain.  You have rapid weight gain or weight loss.  You have  shortness of breath with chest pain.  You notice sudden or extreme swelling of your face, hands, ankles, feet, or legs.  You have not felt your baby move in over an hour.  You have severe headaches that do not go away when you take medicine.  You have vision changes. Summary The second trimester is from week 14 through w Carpal Tunnel Syndrome  Carpal tunnel syndrome is a condition that causes pain in your hand and arm. The carpal tunnel is a narrow area located on the palm side of your wrist. Repeated wrist motion or certain diseases may cause swelling within the tunnel. This swelling pinches the main nerve in the wrist (median nerve). What are the causes? This condition may be caused by: Repeated wrist motions. Wrist injuries. Arthritis. A cyst or tumor in the carpal tunnel. Fluid buildup during pregnancy. Sometimes the cause of this condition is not known. What increases the risk? The following factors may make you more likely to develop this condition: Having a job, such as being a Haematologist, that requires you to repeatedly move your wrist in the same motion. Being a woman. Having certain conditions, such as: Diabetes. Obesity. An underactive thyroid (hypothyroidism). Kidney failure. What are the signs or symptoms? Symptoms of this condition include: A tingling feeling in your fingers, especially in your thumb, index, and middle fingers. Tingling or numbness in your hand. An aching feeling in your entire arm, especially when your wrist and elbow are bent for a long time. Wrist pain that goes up your arm to your shoulder. Pain that goes down into your palm or fingers. A weak feeling in your hands. You may have trouble grabbing and holding items. Your symptoms may feel worse during the night. How is this diagnosed? This condition is diagnosed with a medical history and physical exam. You may also have tests, including: Electromyogram (EMG). This test measures  electrical signals sent by your nerves into the muscles. Nerve conduction study. This test measures how well electrical signals pass through your nerves. Imaging tests, such as X-rays, ultrasound, and MRI. These tests check for possible causes of your condition. How is this treated? This condition may be treated with: Lifestyle changes. It is important to stop or change the activity that caused your condition. Doing exercise and activities to strengthen your muscles and bones (physical therapy). Learning how to use your hand again after diagnosis (occupational therapy). Medicines for pain and inflammation. This may include medicine that is injected into your wrist. A wrist splint. Surgery. Follow these instructions at home: If you have a splint: Wear  the splint as told by your health care provider. Remove it only as told by your health care provider. Loosen the splint if your fingers tingle, become numb, or turn cold and blue. Keep the splint clean. If the splint is not waterproof: Do not let it get wet. Cover it with a watertight covering when you take a bath or shower. Managing pain, stiffness, and swelling  If directed, put ice on the painful area: If you have a removable splint, remove it as told by your health care provider. Put ice in a plastic bag. Place a towel between your skin and the bag. Leave the ice on for 20 minutes, 2-3 times per day. General instructions Take over-the-counter and prescription medicines only as told by your health care provider. Rest your wrist from any activity that may be causing your pain. If your condition is work related, talk with your employer about changes that can be made, such as getting a wrist pad to use while typing. Do any exercises as told by your health care provider, physical therapist, or occupational therapist. Keep all follow-up visits as told by your health care provider. This is important. Contact a health care provider if: You  have new symptoms. Your pain is not controlled with medicines. Your symptoms get worse. Get help right away if: You have severe numbness or tingling in your wrist or hand. Summary Carpal tunnel syndrome is a condition that causes pain in your hand and arm. It is usually caused by repeated wrist motions. Lifestyle changes and medicines are used to treat carpal tunnel syndrome. Surgery may be recommended. Follow your health care provider's instructions about wearing a splint, resting from activity, keeping follow-up visits, and calling for help. This information is not intended to replace advice given to you by your health care provider. Make sure you discuss any questions you have with your health care provider. Document Revised: 05/22/2017 Document Reviewed: 05/22/2017 Elsevier Patient Education  2020 [employer]  eek 27 (months 4 through 6). It is also a time when the fetus is growing rapidly.  Your body goes through many changes during pregnancy. The changes vary from woman to woman.  Avoid all smoking, herbs, alcohol, and unprescribed drugs. These chemicals affect the formation and growth your baby.  Do not use any tobacco products, such as cigarettes, chewing tobacco, and e-cigarettes. If you need help quitting, ask your health care provider.  Contact your health care provider if you have any questions. Keep all prenatal visits as told by your health care provider. This is important. This information is not intended to replace advice given to you by your health care provider. Make sure you discuss any questions you have with your health care provider. Document Revised: 05/07/2018 Document Reviewed: 02/19/2016 Elsevier Patient Education  2020 [employer].

## 2019-11-03 NOTE — Progress Notes (Signed)
    Routine Prenatal Care Visit  Subjective  Vicki Mcdowell is a 34 y.o. G1P0000 at [redacted]w[redacted]d being seen today for ongoing prenatal care.  She is currently monitored for the following issues for this low-risk pregnancy and has Supervision of other normal pregnancy, antepartum on their problem list.  ----------------------------------------------------------------------------------- Patient reports numbness of pointer and middle finger on right hand for 2 weeks.    Contractions: Not present. Vag. Bleeding: None.  Movement: Present. Denies leaking of fluid.  ----------------------------------------------------------------------------------- The following portions of the patient's history were reviewed and updated as appropriate: allergies, current medications, past family history, past medical history, past social history, past surgical history and problem list. Problem list updated.   Objective  Blood pressure 118/62, weight 192 lb (87.1 kg), last menstrual period 05/18/2019. Pregravid weight 189 lb (85.7 kg) Total Weight Gain 3 lb (1.361 kg) Urinalysis:      Fetal Status: Fetal Heart Rate (bpm): 140 Fundal Height: 26 cm Movement: Present     General:  Alert, oriented and cooperative. Patient is in no acute distress.  Skin: Skin is warm and dry. No rash noted.   Cardiovascular: Normal heart rate noted  Respiratory: Normal respiratory effort, no problems with respiration noted  Abdomen: Soft, gravid, appropriate for gestational age. Pain/Pressure: Absent     Pelvic:  Cervical exam deferred        Extremities: Normal range of motion.  Edema: None  Mental Status: Normal mood and affect. Normal behavior. Normal judgment and thought content.     Assessment   34 y.o. G1P0000 at [redacted]w[redacted]d by  02/22/2020, by Last Menstrual Period presenting for routine prenatal visit  Plan   pregnancy 1 Problems (from 07/08/19 to present)    Problem Noted Resolved   Supervision of other normal pregnancy,  antepartum 07/08/2019 by [name], MD No   Overview Addendum 10/20/2019  1:30 PM by Nadara Mustard, MD     Nursing Staff Provider  Office Location  [facility] Dating   LMP = 7wk [place]  Language  English Anatomy US   incomplete  Flu Vaccine   Genetic Screen  NIPS:  Normal gender surprise  TDaP vaccine    Hgb A1C or  GTT Third trimester :   Rhogam   not needed   LAB RESULTS   Feeding Plan  Bottle Blood Type A/Positive/-- (06/11 1500)   Contraception  OCP Antibody Negative (06/11 1500)  Circumcision  Rubella <0.90 (06/11 1500)  Pediatrician   RPR Non Reactive (06/11 1500)   Support Person  [name] HBsAg Negative (06/11 1500)   Prenatal Classes  discussed HIV Non Reactive (06/11 1500)    Varicella immune   BTL Consent no GBS  (For PCN allergy, check sensitivities)        VBAC Consent n/a Pap  2019 NIL         Previous Version       Discussed supportive care of carpal tunnel, referral to orthopedics made.   Gestational age appropriate obstetric precautions including but not limited to vaginal bleeding, contractions, leaking of fluid and fetal movement were reviewed in detail with the patient.    Return in about 4 weeks (around 12/01/2019) for ROB, growth [place],  and 1 GTT.  [name] MD [facility], [facility] 11/03/2019, 1:46 PM

## 2019-11-03 NOTE — Progress Notes (Signed)
poc

## 2019-11-08 ENCOUNTER — Encounter: Payer: Self-pay | Admitting: Orthopaedic Surgery

## 2019-11-08 ENCOUNTER — Ambulatory Visit (INDEPENDENT_AMBULATORY_CARE_PROVIDER_SITE_OTHER): Payer: Medicaid Other | Admitting: Orthopaedic Surgery

## 2019-11-08 ENCOUNTER — Other Ambulatory Visit: Payer: Self-pay

## 2019-11-08 DIAGNOSIS — G5601 Carpal tunnel syndrome, right upper limb: Secondary | ICD-10-CM | POA: Diagnosis not present

## 2019-11-08 NOTE — Progress Notes (Signed)
Office Visit Note   Patient: Vicki Mcdowell           Date of Birth: Jan 11, 1986           MRN: [id number] Visit Date: 11/08/2019              Requested by: [name], MD [address] [place],  [place] [address] PCP: Ailene Ravel, MD   Assessment & Plan: Visit Diagnoses:  1. Right carpal tunnel syndrome     Plan: Impression is pregnancy-induced right carpal tunnel syndrome.  We will start with a carpal tunnel brace to wear at nighttime.  Patient instructed to follow-up if she does not notice any improvement or she would like to try cortisone injection.  Follow-up as needed otherwise.  Follow-Up Instructions: Return if symptoms worsen or fail to improve.   Orders:  No orders of the defined types were placed in this encounter.  No orders of the defined types were placed in this encounter.     Procedures: No procedures performed   Clinical Data: No additional findings.   Subjective: Chief Complaint  Patient presents with  . 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  She has numbness to the tips of her right index and long fingers.  Denies any pain.  She has numbness when she is writing or driving a car.   Review of Systems  Constitutional: Negative.   HENT: Negative.   Eyes: Negative.   Respiratory: Negative.   Cardiovascular: Negative.   Endocrine: Negative.   Musculoskeletal: Negative.   Neurological: Negative.   Hematological: Negative.   Psychiatric/Behavioral: Negative.   All other systems reviewed and are negative.    Objective: Vital Signs: LMP 05/18/2019   Physical Exam Vitals and nursing note reviewed.  Constitutional:      Appearance: She is well-developed.  HENT:     Head: Normocephalic and atraumatic.  Pulmonary:     Effort: Pulmonary effort is normal.  Abdominal:     Palpations:  Abdomen is soft.  Musculoskeletal:     Cervical back: Neck supple.  Skin:    General: Skin is warm.     Capillary Refill: Capillary refill takes less than 2 seconds.  Neurological:     Mental Status: She is alert and oriented to person, place, and time.  Psychiatric:        Behavior: Behavior normal.        Thought Content: Thought content normal.        Judgment: Judgment normal.     Ortho Exam Right hand shows no muscle atrophy.  Decreased sensation to the tips of the index and long finger.  Positive Durkin's and Phalen's.  Positive Tinel.  No neurovascular compromise. Specialty Comments:  No specialty comments available.  Imaging: No results found.   PMFS History: Patient Active Problem List   Diagnosis Date Noted  . Supervision of other normal pregnancy, antepartum 07/08/2019   Past Medical History:  Diagnosis Date  . VP (ventriculoperitoneal) shunt status     History reviewed. No pertinent family history.  Past Surgical History:  Procedure Laterality Date  . DG HYSTEROGRAM (HSG)  10/28/2018      . hydroceph    . SHUNT REVISION VENTRICULAR-PERITONEAL     Social History   Occupational History  . Not on file  Tobacco Use  .  Smoking status: Never Smoker  . Smokeless tobacco: Never Used  Vaping Use  . Vaping Use: Never used  Substance and Sexual Activity  . Alcohol use: Never  . Drug use: No  . Sexual activity: Yes    Birth control/protection: None

## 2019-11-29 ENCOUNTER — Ambulatory Visit (INDEPENDENT_AMBULATORY_CARE_PROVIDER_SITE_OTHER): Payer: Medicaid Other

## 2019-11-29 ENCOUNTER — Other Ambulatory Visit: Payer: Self-pay

## 2019-11-29 DIAGNOSIS — O99210 Obesity complicating pregnancy, unspecified trimester: Secondary | ICD-10-CM | POA: Diagnosis not present

## 2019-11-29 DIAGNOSIS — Z3A27 27 weeks gestation of pregnancy: Secondary | ICD-10-CM

## 2019-11-29 DIAGNOSIS — Z3480 Encounter for supervision of other normal pregnancy, unspecified trimester: Secondary | ICD-10-CM

## 2019-12-01 ENCOUNTER — Other Ambulatory Visit: Payer: Self-pay

## 2019-12-01 ENCOUNTER — Encounter: Payer: Self-pay | Admitting: Obstetrics and Gynecology

## 2019-12-01 ENCOUNTER — Other Ambulatory Visit: Payer: Medicaid Other

## 2019-12-01 ENCOUNTER — Ambulatory Visit (INDEPENDENT_AMBULATORY_CARE_PROVIDER_SITE_OTHER): Payer: Medicaid Other | Admitting: Obstetrics and Gynecology

## 2019-12-01 VITALS — BP 112/68 | Ht 64.0 in | Wt 193.2 lb

## 2019-12-01 DIAGNOSIS — Z3480 Encounter for supervision of other normal pregnancy, unspecified trimester: Secondary | ICD-10-CM

## 2019-12-01 DIAGNOSIS — Z3A28 28 weeks gestation of pregnancy: Secondary | ICD-10-CM

## 2019-12-01 LAB — POCT URINALYSIS DIPSTICK OB
Glucose, UA: NEGATIVE
POC,PROTEIN,UA: NEGATIVE

## 2019-12-01 NOTE — Addendum Note (Signed)
Addended by: Clement Husbands A on: 12/01/2019 02:48 PM   Modules accepted: Orders

## 2019-12-01 NOTE — Patient Instructions (Signed)
Third Trimester of Pregnancy The third trimester is from week 28 through week 40 (months 7 through 9). The third trimester is a time when the unborn baby (fetus) is growing rapidly. At the end of the ninth month, the fetus is about 20 inches in length and weighs 6-10 pounds. Body changes during your third trimester Your body will continue to go through many changes during pregnancy. The changes vary from woman to woman. During the third trimester:  Your weight will continue to increase. You can expect to gain 25-35 pounds (11-16 kg) by the end of the pregnancy.  You may begin to get stretch marks on your hips, abdomen, and breasts.  You may urinate more often because the fetus is moving lower into your pelvis and pressing on your bladder.  You may develop or continue to have heartburn. This is caused by increased hormones that slow down muscles in the digestive tract.  You may develop or continue to have constipation because increased hormones slow digestion and cause the muscles that push waste through your intestines to relax.  You may develop hemorrhoids. These are swollen veins (varicose veins) in the rectum that can itch or be painful.  You may develop swollen, bulging veins (varicose veins) in your legs.  You may have increased body aches in the pelvis, back, or thighs. This is due to weight gain and increased hormones that are relaxing your joints.  You may have changes in your hair. These can include thickening of your hair, rapid growth, and changes in texture. Some women also have hair loss during or after pregnancy, or hair that feels dry or thin. Your hair will most likely return to normal after your baby is born.  Your breasts will continue to grow and they will continue to become tender. A yellow fluid (colostrum) may leak from your breasts. This is the first milk you are producing for your baby.  Your belly button may stick out.  You may notice more swelling in your hands,  face, or ankles.  You may have increased tingling or numbness in your hands, arms, and legs. The skin on your belly may also feel numb.  You may feel short of breath because of your expanding uterus.  You may have more problems sleeping. This can be caused by the size of your belly, increased need to urinate, and an increase in your body's metabolism.  You may notice the fetus "dropping," or moving lower in your abdomen (lightening).  You may have increased vaginal discharge.  You may notice your joints feel loose and you may have pain around your pelvic bone. What to expect at prenatal visits You will have prenatal exams every 2 weeks until week 36. Then you will have weekly prenatal exams. During a routine prenatal visit:  You will be weighed to make sure you and the baby are growing normally.  Your blood pressure will be taken.  Your abdomen will be measured to track your baby's growth.  The fetal heartbeat will be listened to.  Any test results from the previous visit will be discussed.  You may have a cervical check near your due date to see if your cervix has softened or thinned (effaced).  You will be tested for Group B streptococcus. This happens between 35 and 37 weeks. Your health care provider may ask you:  What your birth plan is.  How you are feeling.  If you are feeling the baby move.  If you have had any abnormal   symptoms, such as leaking fluid, bleeding, severe headaches, or abdominal cramping.  If you are using any tobacco products, including cigarettes, chewing tobacco, and electronic cigarettes.  If you have any questions. Other tests or screenings that may be performed during your third trimester include:  Blood tests that check for low iron levels (anemia).  Fetal testing to check the health, activity level, and growth of the fetus. Testing is done if you have certain medical conditions or if there are problems during the pregnancy.  Nonstress test  (NST). This test checks the health of your baby to make sure there are no signs of problems, such as the baby not getting enough oxygen. During this test, a belt is placed around your belly. The baby is made to move, and its heart rate is monitored during movement. What is false labor? False labor is a condition in which you feel small, irregular tightenings of the muscles in the womb (contractions) that usually go away with rest, changing position, or drinking water. These are called Braxton Hicks contractions. Contractions may last for hours, days, or even weeks before true labor sets in. If contractions come at regular intervals, become more frequent, increase in intensity, or become painful, you should see your health care provider. What are the signs of labor?  Abdominal cramps.  Regular contractions that start at 10 minutes apart and become stronger and more frequent with time.  Contractions that start on the top of the uterus and spread down to the lower abdomen and back.  Increased pelvic pressure and dull back pain.  A watery or bloody mucus discharge that comes from the vagina.  Leaking of amniotic fluid. This is also known as your "water breaking." It could be a slow trickle or a gush. Let your health care provider know if it has a color or strange odor. If you have any of these signs, call your health care provider right away, even if it is before your due date. Follow these instructions at home: Medicines  Follow your health care provider's instructions regarding medicine use. Specific medicines may be either safe or unsafe to take during pregnancy.  Take a prenatal vitamin that contains at least 600 micrograms (mcg) of folic acid.  If you develop constipation, try taking a stool softener if your health care provider approves. Eating and drinking   Eat a balanced diet that includes fresh fruits and vegetables, whole grains, good sources of protein such as meat, eggs, or tofu,  and low-fat dairy. Your health care provider will help you determine the amount of weight gain that is right for you.  Avoid raw meat and uncooked cheese. These carry germs that can cause birth defects in the baby.  If you have low calcium intake from food, talk to your health care provider about whether you should take a daily calcium supplement.  Eat four or five small meals rather than three large meals a day.  Limit foods that are high in fat and processed sugars, such as fried and sweet foods.  To prevent constipation: ? Drink enough fluid to keep your urine clear or pale yellow. ? Eat foods that are high in fiber, such as fresh fruits and vegetables, whole grains, and beans. Activity  Exercise only as directed by your health care provider. Most women can continue their usual exercise routine during pregnancy. Try to exercise for 30 minutes at least 5 days a week. Stop exercising if you experience uterine contractions.  Avoid heavy lifting.  Do   not exercise in extreme heat or humidity, or at high altitudes.  Wear low-heel, comfortable shoes.  Practice good posture.  You may continue to have sex unless your health care provider tells you otherwise. Relieving pain and discomfort  Take frequent breaks and rest with your legs elevated if you have leg cramps or low back pain.  Take warm sitz baths to soothe any pain or discomfort caused by hemorrhoids. Use hemorrhoid cream if your health care provider approves.  Wear a good support bra to prevent discomfort from breast tenderness.  If you develop varicose veins: ? Wear support pantyhose or compression stockings as told by your healthcare provider. ? Elevate your feet for 15 minutes, 3-4 times a day. Prenatal care  Write down your questions. Take them to your prenatal visits.  Keep all your prenatal visits as told by your health care provider. This is important. Safety  Wear your seat belt at all times when driving.  Make  a list of emergency phone numbers, including numbers for family, friends, the hospital, and police and fire departments. General instructions  Avoid cat litter boxes and soil used by cats. These carry germs that can cause birth defects in the baby. If you have a cat, ask someone to clean the litter box for you.  Do not travel far distances unless it is absolutely necessary and only with the approval of your health care provider.  Do not use hot tubs, steam rooms, or saunas.  Do not drink alcohol.  Do not use any products that contain nicotine or tobacco, such as cigarettes and e-cigarettes. If you need help quitting, ask your health care provider.  Do not use any medicinal herbs or unprescribed drugs. These chemicals affect the formation and growth of the baby.  Do not douche or use tampons or scented sanitary pads.  Do not cross your legs for long periods of time.  To prepare for the arrival of your baby: ? Take prenatal classes to understand, practice, and ask questions about labor and delivery. ? Make a trial run to the hospital. ? Visit the hospital and tour the maternity area. ? Arrange for maternity or paternity leave through employers. ? Arrange for family and friends to take care of pets while you are in the hospital. ? Purchase a rear-facing car seat and make sure you know how to install it in your car. ? Pack your hospital bag. ? Prepare the baby's nursery. Make sure to remove all pillows and stuffed animals from the baby's crib to prevent suffocation.  Visit your dentist if you have not gone during your pregnancy. Use a soft toothbrush to brush your teeth and be gentle when you floss. Contact a health care provider if:  You are unsure if you are in labor or if your water has broken.  You become dizzy.  You have mild pelvic cramps, pelvic pressure, or nagging pain in your abdominal area.  You have lower back pain.  You have persistent nausea, vomiting, or  diarrhea.  You have an unusual or bad smelling vaginal discharge.  You have pain when you urinate. Get help right away if:  Your water breaks before 37 weeks.  You have regular contractions less than 5 minutes apart before 37 weeks.  You have a fever.  You are leaking fluid from your vagina.  You have spotting or bleeding from your vagina.  You have severe abdominal pain or cramping.  You have rapid weight loss or weight gain.  You have   shortness of breath with chest pain.  You notice sudden or extreme swelling of your face, hands, ankles, feet, or legs.  Your baby makes fewer than 10 movements in 2 hours.  You have severe headaches that do not go away when you take medicine.  You have vision changes. Summary  The third trimester is from week 28 through week 40, months 7 through 9. The third trimester is a time when the unborn baby (fetus) is growing rapidly.  During the third trimester, your discomfort may increase as you and your baby continue to gain weight. You may have abdominal, leg, and back pain, sleeping problems, and an increased need to urinate.  During the third trimester your breasts will keep growing and they will continue to become tender. A yellow fluid (colostrum) may leak from your breasts. This is the first milk you are producing for your baby.  False labor is a condition in which you feel small, irregular tightenings of the muscles in the womb (contractions) that eventually go away. These are called Braxton Hicks contractions. Contractions may last for hours, days, or even weeks before true labor sets in.  Signs of labor can include: abdominal cramps; regular contractions that start at 10 minutes apart and become stronger and more frequent with time; watery or bloody mucus discharge that comes from the vagina; increased pelvic pressure and dull back pain; and leaking of amniotic fluid. This information is not intended to replace advice given to you by your  health care provider. Make sure you discuss any questions you have with your health care provider. Document Revised: 05/06/2018 Document Reviewed: 02/19/2016 Elsevier Patient Education  2020 [employer]  

## 2019-12-01 NOTE — Progress Notes (Signed)
    Routine Prenatal Care Visit  Subjective  Vicki Mcdowell is a 34 y.o. G1P0000 at [redacted]w[redacted]d being seen today for ongoing prenatal care.  She is currently monitored for the following issues for this low-risk pregnancy and has Supervision of other normal pregnancy, antepartum on their problem list.  ----------------------------------------------------------------------------------- Patient reports no complaints.   Contractions: Not present. Vag. Bleeding: None.  Movement: Present. Denies leaking of fluid.  ----------------------------------------------------------------------------------- The following portions of the patient's history were reviewed and updated as appropriate: allergies, current medications, past family history, past medical history, past social history, past surgical history and problem list. Problem list updated.   Objective  Blood pressure 112/68, height 5\' 4"  (1.626 m), weight 193 lb 3.2 oz (87.6 kg), last menstrual period 05/18/2019. Pregravid weight 189 lb (85.7 kg) Total Weight Gain 4 lb 3.2 oz (1.905 kg) Urinalysis:      Fetal Status: Fetal Heart Rate (bpm): 140 Fundal Height: 32 cm Movement: Present     General:  Alert, oriented and cooperative. Patient is in no acute distress.  Skin: Skin is warm and dry. No rash noted.   Cardiovascular: Normal heart rate noted  Respiratory: Normal respiratory effort, no problems with respiration noted  Abdomen: Soft, gravid, appropriate for gestational age. Pain/Pressure: Absent     Pelvic:  Cervical exam deferred        Extremities: Normal range of motion.  Edema: None  Mental Status: Normal mood and affect. Normal behavior. Normal judgment and thought content.     Assessment   34 y.o. G1P0000 at [redacted]w[redacted]d by  02/22/2020, by Last Menstrual Period presenting for routine prenatal visit  Plan   pregnancy 1 Problems (from 07/08/19 to present)    Problem Noted Resolved   Supervision of other normal pregnancy, antepartum  07/08/2019 by 09/07/2019, MD No   Overview Addendum 12/01/2019  2:35 PM by 13/04/2019, MD     Nursing Staff Provider  Office Location  [facility] Dating   LMP = 7wk [name]  Language  English Anatomy US   incomplete  Flu Vaccine  declines Genetic Screen  NIPS:  Normal gender surprise  TDaP vaccine    Hgb A1C or  GTT Third trimester :   Rhogam   not needed   LAB RESULTS   Feeding Plan  Bottle Blood Type A/Positive/-- (06/11 1500)   Contraception  OCP Antibody Negative (06/11 1500)  Circumcision  Rubella <0.90 (06/11 1500)  Pediatrician   RPR Non Reactive (06/11 1500)   Support Person  [name] HBsAg Negative (06/11 1500)   Prenatal Classes  discussed HIV Non Reactive (06/11 1500)    Varicella immune   BTL Consent no GBS  (For PCN allergy, check sensitivities)        VBAC Consent n/a Pap  2019 NIL         Previous Version       Gorwth 2020 at 28 weeks WNL 1 GTT today normal  Gestational age appropriate obstetric precautions including but not limited to vaginal bleeding, contractions, leaking of fluid and fetal movement were reviewed in detail with the patient.    Return in about 2 weeks (around 12/15/2019) for ROB visit in person.  12/17/2019 MD [facility], [facility] 12/01/2019, 2:45 PM

## 2019-12-02 LAB — 28 WEEK RH+PANEL
Basophils Absolute: 0 10*3/uL (ref 0.0–0.2)
Basos: 0 %
EOS (ABSOLUTE): 0.1 10*3/uL (ref 0.0–0.4)
Eos: 1 %
Gestational Diabetes Screen: 121 mg/dL (ref 65–139)
HIV Screen 4th Generation wRfx: NONREACTIVE
Hematocrit: 35.9 % (ref 34.0–46.6)
Hemoglobin: 12.1 g/dL (ref 11.1–15.9)
Immature Grans (Abs): 0 10*3/uL (ref 0.0–0.1)
Immature Granulocytes: 0 %
Lymphocytes Absolute: 1.8 10*3/uL (ref 0.7–3.1)
Lymphs: 24 %
MCH: 30.6 pg (ref 26.6–33.0)
MCHC: 33.7 g/dL (ref 31.5–35.7)
MCV: 91 fL (ref 79–97)
Monocytes Absolute: 0.4 10*3/uL (ref 0.1–0.9)
Monocytes: 5 %
Neutrophils Absolute: 5.2 10*3/uL (ref 1.4–7.0)
Neutrophils: 70 %
Platelets: 314 10*3/uL (ref 150–450)
RBC: 3.95 x10E6/uL (ref 3.77–5.28)
RDW: 12.1 % (ref 11.7–15.4)
RPR Ser Ql: NONREACTIVE
WBC: 7.5 10*3/uL (ref 3.4–10.8)

## 2019-12-19 ENCOUNTER — Encounter: Payer: Self-pay | Admitting: Obstetrics and Gynecology

## 2019-12-19 ENCOUNTER — Ambulatory Visit (INDEPENDENT_AMBULATORY_CARE_PROVIDER_SITE_OTHER): Payer: Medicaid Other | Admitting: Obstetrics and Gynecology

## 2019-12-19 ENCOUNTER — Other Ambulatory Visit: Payer: Self-pay

## 2019-12-19 VITALS — BP 90/60 | Wt 190.0 lb

## 2019-12-19 DIAGNOSIS — Z3480 Encounter for supervision of other normal pregnancy, unspecified trimester: Secondary | ICD-10-CM

## 2019-12-19 DIAGNOSIS — O99210 Obesity complicating pregnancy, unspecified trimester: Secondary | ICD-10-CM

## 2019-12-19 DIAGNOSIS — Z3A30 30 weeks gestation of pregnancy: Secondary | ICD-10-CM

## 2019-12-19 LAB — POCT URINALYSIS DIPSTICK OB
Glucose, UA: NEGATIVE
POC,PROTEIN,UA: NEGATIVE

## 2019-12-19 NOTE — Patient Instructions (Signed)
Third Trimester of Pregnancy The third trimester is from week 28 through week 40 (months 7 through 9). The third trimester is a time when the unborn baby (fetus) is growing rapidly. At the end of the ninth month, the fetus is about 20 inches in length and weighs 6-10 pounds. Body changes during your third trimester Your body will continue to go through many changes during pregnancy. The changes vary from woman to woman. During the third trimester:  Your weight will continue to increase. You can expect to gain 25-35 pounds (11-16 kg) by the end of the pregnancy.  You may begin to get stretch marks on your hips, abdomen, and breasts.  You may urinate more often because the fetus is moving lower into your pelvis and pressing on your bladder.  You may develop or continue to have heartburn. This is caused by increased hormones that slow down muscles in the digestive tract.  You may develop or continue to have constipation because increased hormones slow digestion and cause the muscles that push waste through your intestines to relax.  You may develop hemorrhoids. These are swollen veins (varicose veins) in the rectum that can itch or be painful.  You may develop swollen, bulging veins (varicose veins) in your legs.  You may have increased body aches in the pelvis, back, or thighs. This is due to weight gain and increased hormones that are relaxing your joints.  You may have changes in your hair. These can include thickening of your hair, rapid growth, and changes in texture. Some women also have hair loss during or after pregnancy, or hair that feels dry or thin. Your hair will most likely return to normal after your baby is born.  Your breasts will continue to grow and they will continue to become tender. A yellow fluid (colostrum) may leak from your breasts. This is the first milk you are producing for your baby.  Your belly button may stick out.  You may notice more swelling in your hands,  face, or ankles.  You may have increased tingling or numbness in your hands, arms, and legs. The skin on your belly may also feel numb.  You may feel short of breath because of your expanding uterus.  You may have more problems sleeping. This can be caused by the size of your belly, increased need to urinate, and an increase in your body's metabolism.  You may notice the fetus "dropping," or moving lower in your abdomen (lightening).  You may have increased vaginal discharge.  You may notice your joints feel loose and you may have pain around your pelvic bone. What to expect at prenatal visits You will have prenatal exams every 2 weeks until week 36. Then you will have weekly prenatal exams. During a routine prenatal visit:  You will be weighed to make sure you and the baby are growing normally.  Your blood pressure will be taken.  Your abdomen will be measured to track your baby's growth.  The fetal heartbeat will be listened to.  Any test results from the previous visit will be discussed.  You may have a cervical check near your due date to see if your cervix has softened or thinned (effaced).  You will be tested for Group B streptococcus. This happens between 35 and 37 weeks. Your health care provider may ask you:  What your birth plan is.  How you are feeling.  If you are feeling the baby move.  If you have had any abnormal   symptoms, such as leaking fluid, bleeding, severe headaches, or abdominal cramping.  If you are using any tobacco products, including cigarettes, chewing tobacco, and electronic cigarettes.  If you have any questions. Other tests or screenings that may be performed during your third trimester include:  Blood tests that check for low iron levels (anemia).  Fetal testing to check the health, activity level, and growth of the fetus. Testing is done if you have certain medical conditions or if there are problems during the pregnancy.  Nonstress test  (NST). This test checks the health of your baby to make sure there are no signs of problems, such as the baby not getting enough oxygen. During this test, a belt is placed around your belly. The baby is made to move, and its heart rate is monitored during movement. What is false labor? False labor is a condition in which you feel small, irregular tightenings of the muscles in the womb (contractions) that usually go away with rest, changing position, or drinking water. These are called Braxton Hicks contractions. Contractions may last for hours, days, or even weeks before true labor sets in. If contractions come at regular intervals, become more frequent, increase in intensity, or become painful, you should see your health care provider. What are the signs of labor?  Abdominal cramps.  Regular contractions that start at 10 minutes apart and become stronger and more frequent with time.  Contractions that start on the top of the uterus and spread down to the lower abdomen and back.  Increased pelvic pressure and dull back pain.  A watery or bloody mucus discharge that comes from the vagina.  Leaking of amniotic fluid. This is also known as your "water breaking." It could be a slow trickle or a gush. Let your health care provider know if it has a color or strange odor. If you have any of these signs, call your health care provider right away, even if it is before your due date. Follow these instructions at home: Medicines  Follow your health care provider's instructions regarding medicine use. Specific medicines may be either safe or unsafe to take during pregnancy.  Take a prenatal vitamin that contains at least 600 micrograms (mcg) of folic acid.  If you develop constipation, try taking a stool softener if your health care provider approves. Eating and drinking   Eat a balanced diet that includes fresh fruits and vegetables, whole grains, good sources of protein such as meat, eggs, or tofu,  and low-fat dairy. Your health care provider will help you determine the amount of weight gain that is right for you.  Avoid raw meat and uncooked cheese. These carry germs that can cause birth defects in the baby.  If you have low calcium intake from food, talk to your health care provider about whether you should take a daily calcium supplement.  Eat four or five small meals rather than three large meals a day.  Limit foods that are high in fat and processed sugars, such as fried and sweet foods.  To prevent constipation: ? Drink enough fluid to keep your urine clear or pale yellow. ? Eat foods that are high in fiber, such as fresh fruits and vegetables, whole grains, and beans. Activity  Exercise only as directed by your health care provider. Most women can continue their usual exercise routine during pregnancy. Try to exercise for 30 minutes at least 5 days a week. Stop exercising if you experience uterine contractions.  Avoid heavy lifting.  Do   not exercise in extreme heat or humidity, or at high altitudes.  Wear low-heel, comfortable shoes.  Practice good posture.  You may continue to have sex unless your health care provider tells you otherwise. Relieving pain and discomfort  Take frequent breaks and rest with your legs elevated if you have leg cramps or low back pain.  Take warm sitz baths to soothe any pain or discomfort caused by hemorrhoids. Use hemorrhoid cream if your health care provider approves.  Wear a good support bra to prevent discomfort from breast tenderness.  If you develop varicose veins: ? Wear support pantyhose or compression stockings as told by your healthcare provider. ? Elevate your feet for 15 minutes, 3-4 times a day. Prenatal care  Write down your questions. Take them to your prenatal visits.  Keep all your prenatal visits as told by your health care provider. This is important. Safety  Wear your seat belt at all times when driving.  Make  a list of emergency phone numbers, including numbers for family, friends, the hospital, and police and fire departments. General instructions  Avoid cat litter boxes and soil used by cats. These carry germs that can cause birth defects in the baby. If you have a cat, ask someone to clean the litter box for you.  Do not travel far distances unless it is absolutely necessary and only with the approval of your health care provider.  Do not use hot tubs, steam rooms, or saunas.  Do not drink alcohol.  Do not use any products that contain nicotine or tobacco, such as cigarettes and e-cigarettes. If you need help quitting, ask your health care provider.  Do not use any medicinal herbs or unprescribed drugs. These chemicals affect the formation and growth of the baby.  Do not douche or use tampons or scented sanitary pads.  Do not cross your legs for long periods of time.  To prepare for the arrival of your baby: ? Take prenatal classes to understand, practice, and ask questions about labor and delivery. ? Make a trial run to the hospital. ? Visit the hospital and tour the maternity area. ? Arrange for maternity or paternity leave through employers. ? Arrange for family and friends to take care of pets while you are in the hospital. ? Purchase a rear-facing car seat and make sure you know how to install it in your car. ? Pack your hospital bag. ? Prepare the baby's nursery. Make sure to remove all pillows and stuffed animals from the baby's crib to prevent suffocation.  Visit your dentist if you have not gone during your pregnancy. Use a soft toothbrush to brush your teeth and be gentle when you floss. Contact a health care provider if:  You are unsure if you are in labor or if your water has broken.  You become dizzy.  You have mild pelvic cramps, pelvic pressure, or nagging pain in your abdominal area.  You have lower back pain.  You have persistent nausea, vomiting, or  diarrhea.  You have an unusual or bad smelling vaginal discharge.  You have pain when you urinate. Get help right away if:  Your water breaks before 37 weeks.  You have regular contractions less than 5 minutes apart before 37 weeks.  You have a fever.  You are leaking fluid from your vagina.  You have spotting or bleeding from your vagina.  You have severe abdominal pain or cramping.  You have rapid weight loss or weight gain.  You have   shortness of breath with chest pain.  You notice sudden or extreme swelling of your face, hands, ankles, feet, or legs.  Your baby makes fewer than 10 movements in 2 hours.  You have severe headaches that do not go away when you take medicine.  You have vision changes. Summary  The third trimester is from week 28 through week 40, months 7 through 9. The third trimester is a time when the unborn baby (fetus) is growing rapidly.  During the third trimester, your discomfort may increase as you and your baby continue to gain weight. You may have abdominal, leg, and back pain, sleeping problems, and an increased need to urinate.  During the third trimester your breasts will keep growing and they will continue to become tender. A yellow fluid (colostrum) may leak from your breasts. This is the first milk you are producing for your baby.  False labor is a condition in which you feel small, irregular tightenings of the muscles in the womb (contractions) that eventually go away. These are called Braxton Hicks contractions. Contractions may last for hours, days, or even weeks before true labor sets in.  Signs of labor can include: abdominal cramps; regular contractions that start at 10 minutes apart and become stronger and more frequent with time; watery or bloody mucus discharge that comes from the vagina; increased pelvic pressure and dull back pain; and leaking of amniotic fluid. This information is not intended to replace advice given to you by your  health care provider. Make sure you discuss any questions you have with your health care provider. Document Revised: 05/06/2018 Document Reviewed: 02/19/2016 Elsevier Patient Education  2020 [employer]  

## 2019-12-19 NOTE — Progress Notes (Signed)
    Routine Prenatal Care Visit  Subjective  Vicki Mcdowell is a 34 y.o. G1P0000 at [redacted]w[redacted]d being seen today for ongoing prenatal care.  She is currently monitored for the following issues for this low-risk pregnancy and has Supervision of other normal pregnancy, antepartum on their problem list.  ----------------------------------------------------------------------------------- Patient reports no complaints.   Contractions: Not present. Vag. Bleeding: None.  Movement: Present. Denies leaking of fluid.  ----------------------------------------------------------------------------------- The following portions of the patient's history were reviewed and updated as appropriate: allergies, current medications, past family history, past medical history, past social history, past surgical history and problem list. Problem list updated.   Objective  Blood pressure 90/60, weight 190 lb (86.2 kg), last menstrual period 05/18/2019. Pregravid weight 189 lb (85.7 kg) Total Weight Gain 1 lb (0.454 kg) Urinalysis:      Fetal Status: Fetal Heart Rate (bpm): 14- Fundal Height: 32 cm Movement: Present     General:  Alert, oriented and cooperative. Patient is in no acute distress.  Skin: Skin is warm and dry. No rash noted.   Cardiovascular: Normal heart rate noted  Respiratory: Normal respiratory effort, no problems with respiration noted  Abdomen: Soft, gravid, appropriate for gestational age. Pain/Pressure: Absent     Pelvic:  Cervical exam deferred        Extremities: Normal range of motion.  Edema: None  Mental Status: Normal mood and affect. Normal behavior. Normal judgment and thought content.     Assessment   34 y.o. G1P0000 at [redacted]w[redacted]d by  02/22/2020, by Last Menstrual Period presenting for routine prenatal visit  Plan   pregnancy 1 Problems (from 07/08/19 to present)    Problem Noted Resolved   Supervision of other normal pregnancy, antepartum 07/08/2019 by [name], MD No    Overview Addendum 12/19/2019  9:25 AM by [name], MD     Nursing Staff Provider  Office Location  [facility] Dating   LMP = 7wk [place]  Language  English Anatomy US   incomplete  Flu Vaccine  declines Genetic Screen  NIPS:  Normal gender surprise  TDaP vaccine    Hgb A1C or  GTT Third trimester : 121  Rhogam   not needed   LAB RESULTS   Feeding Plan  Bottle Blood Type A/Positive/-- (06/11 1500)   Contraception  OCP Antibody Negative (06/11 1500)  Circumcision  Rubella <0.90 (06/11 1500)  Pediatrician   RPR Non Reactive (06/11 1500)   Support Person  [name] HBsAg Negative (06/11 1500)   Prenatal Classes  discussed HIV Non Reactive (06/11 1500)    Varicella immune   BTL Consent no GBS  (For PCN allergy, check sensitivities)        VBAC Consent n/a Pap  2019 NIL         Previous Version       Desires TDAP next visit  Gestational age appropriate obstetric precautions including but not limited to vaginal bleeding, contractions, leaking of fluid and fetal movement were reviewed in detail with the patient.    Return in about 2 weeks (around 01/02/2020) for ROB in person and growth [place].  [name] MD [facility], [facility] 12/19/2019, 9:25 AM

## 2020-01-04 ENCOUNTER — Encounter: Payer: Medicaid Other | Admitting: Obstetrics and Gynecology

## 2020-01-04 ENCOUNTER — Ambulatory Visit: Payer: Medicaid Other

## 2020-01-12 ENCOUNTER — Encounter: Payer: Self-pay | Admitting: Obstetrics and Gynecology

## 2020-01-12 ENCOUNTER — Ambulatory Visit (INDEPENDENT_AMBULATORY_CARE_PROVIDER_SITE_OTHER): Payer: Medicaid Other

## 2020-01-12 ENCOUNTER — Other Ambulatory Visit: Payer: Self-pay

## 2020-01-12 ENCOUNTER — Ambulatory Visit (INDEPENDENT_AMBULATORY_CARE_PROVIDER_SITE_OTHER): Payer: Medicaid Other | Admitting: Obstetrics and Gynecology

## 2020-01-12 VITALS — BP 100/70 | Wt 191.0 lb

## 2020-01-12 DIAGNOSIS — Z3A30 30 weeks gestation of pregnancy: Secondary | ICD-10-CM

## 2020-01-12 DIAGNOSIS — O99210 Obesity complicating pregnancy, unspecified trimester: Secondary | ICD-10-CM

## 2020-01-12 DIAGNOSIS — Z3480 Encounter for supervision of other normal pregnancy, unspecified trimester: Secondary | ICD-10-CM

## 2020-01-12 DIAGNOSIS — Z3A34 34 weeks gestation of pregnancy: Secondary | ICD-10-CM

## 2020-01-12 DIAGNOSIS — Z23 Encounter for immunization: Secondary | ICD-10-CM

## 2020-01-12 DIAGNOSIS — O99213 Obesity complicating pregnancy, third trimester: Secondary | ICD-10-CM

## 2020-01-12 DIAGNOSIS — E669 Obesity, unspecified: Secondary | ICD-10-CM

## 2020-01-12 DIAGNOSIS — Z3A33 33 weeks gestation of pregnancy: Secondary | ICD-10-CM

## 2020-01-12 LAB — POCT URINALYSIS DIPSTICK OB
Glucose, UA: NEGATIVE
POC,PROTEIN,UA: NEGATIVE

## 2020-01-12 NOTE — Progress Notes (Signed)
    Routine Prenatal Care Visit  Subjective  Vicki Mcdowell is a 34 y.o. G1P0000 at [redacted]w[redacted]d being seen today for ongoing prenatal care.  She is currently monitored for the following issues for this low-risk pregnancy and has Supervision of other normal pregnancy, antepartum on their problem list.  ----------------------------------------------------------------------------------- Patient reports no complaints.   Contractions: Not present. Vag. Bleeding: None.  Movement: Present. Denies leaking of fluid.  ----------------------------------------------------------------------------------- The following portions of the patient's history were reviewed and updated as appropriate: allergies, current medications, past family history, past medical history, past social history, past surgical history and problem list. Problem list updated.   Objective  Blood pressure 100/70, weight 191 lb (86.6 kg), last menstrual period 05/18/2019. Pregravid weight 189 lb (85.7 kg) Total Weight Gain 2 lb (0.907 kg) Urinalysis:      Fetal Status: Fetal Heart Rate (bpm): 130 Fundal Height: 35 cm Movement: Present     General:  Alert, oriented and cooperative. Patient is in no acute distress.  Skin: Skin is warm and dry. No rash noted.   Cardiovascular: Normal heart rate noted  Respiratory: Normal respiratory effort, no problems with respiration noted  Abdomen: Soft, gravid, appropriate for gestational age. Pain/Pressure: Present     Pelvic:  Cervical exam deferred        Extremities: Normal range of motion.  Edema: None  ental Status: Normal mood and affect. Normal behavior. Normal judgment and thought content.     Assessment   34 y.o. G1P0000 at [redacted]w[redacted]d by  02/22/2020, by Last Menstrual Period presenting for routine prenatal visit  Plan   pregnancy 1 Problems (from 07/08/19 to present)    Problem Noted Resolved   Supervision of other normal pregnancy, antepartum 07/08/2019 by [name], MD No    Overview Addendum 12/19/2019  9:25 AM by [name], MD     Nursing Staff Provider  Office Location  [facility] Dating   LMP = 7wk [place]  Language  English Anatomy US   incomplete  Flu Vaccine  declines Genetic Screen  NIPS:  Normal gender surprise  TDaP vaccine    Hgb A1C or  GTT Third trimester : 121  Rhogam   not needed   LAB RESULTS   Feeding Plan  Bottle Blood Type A/Positive/-- (06/11 1500)   Contraception  OCP Antibody Negative (06/11 1500)  Circumcision  Rubella <0.90 (06/11 1500)  Pediatrician   RPR Non Reactive (06/11 1500)   Support Person  [name] HBsAg Negative (06/11 1500)   Prenatal Classes  discussed HIV Non Reactive (06/11 1500)    Varicella immune   BTL Consent no GBS  (For PCN allergy, check sensitivities)        VBAC Consent n/a Pap  2019 NIL         Previous Version       -Tdap today -Reviewed growth - AFI: 12.4 cm, AC: 61%  Preterm labor precautions including but not limited to vaginal bleeding, contractions, leaking of fluid and fetal movement were reviewed in detail with the patient.    Return in about 2 weeks (around 01/26/2020) for ROB.  Zipporah Plants, CNM, MSN [facility], [facility] 01/12/2020, 2:00 PM

## 2020-01-12 NOTE — Addendum Note (Signed)
Addended by: Cornelius Moras D on: 01/12/2020 02:29 PM   Modules accepted: Orders

## 2020-01-12 NOTE — Addendum Note (Signed)
Addended by: Cornelius Moras D on: 01/12/2020 02:07 PM   Modules accepted: Orders

## 2020-01-17 ENCOUNTER — Other Ambulatory Visit: Payer: Self-pay | Admitting: Obstetrics & Gynecology

## 2020-01-17 DIAGNOSIS — Z131 Encounter for screening for diabetes mellitus: Secondary | ICD-10-CM

## 2020-01-23 ENCOUNTER — Telehealth: Payer: Self-pay | Admitting: Obstetrics and Gynecology

## 2020-01-23 NOTE — Telephone Encounter (Signed)
Patient took a rapid covid test which came back positive. Pt would like to know if there is any medication she can take. Please advise.

## 2020-01-24 NOTE — Telephone Encounter (Signed)
She can take Tylenol and stay hydrated. If she is interested and has mild to moderate sx she can call to discuss monoclonal antibody infusion.

## 2020-01-24 NOTE — Telephone Encounter (Signed)
Please advise 

## 2020-01-26 ENCOUNTER — Other Ambulatory Visit: Payer: Self-pay

## 2020-01-26 ENCOUNTER — Encounter: Payer: Self-pay | Admitting: Advanced Practice Midwife

## 2020-01-26 ENCOUNTER — Ambulatory Visit (INDEPENDENT_AMBULATORY_CARE_PROVIDER_SITE_OTHER): Payer: Medicaid Other | Admitting: Advanced Practice Midwife

## 2020-01-26 VITALS — Ht 64.0 in | Wt 181.0 lb

## 2020-01-26 DIAGNOSIS — O98513 Other viral diseases complicating pregnancy, third trimester: Secondary | ICD-10-CM | POA: Diagnosis not present

## 2020-01-26 DIAGNOSIS — U071 COVID-19: Secondary | ICD-10-CM | POA: Diagnosis not present

## 2020-01-26 DIAGNOSIS — O99213 Obesity complicating pregnancy, third trimester: Secondary | ICD-10-CM | POA: Diagnosis not present

## 2020-01-26 DIAGNOSIS — Z3A36 36 weeks gestation of pregnancy: Secondary | ICD-10-CM

## 2020-01-26 DIAGNOSIS — Z982 Presence of cerebrospinal fluid drainage device: Secondary | ICD-10-CM

## 2020-01-26 DIAGNOSIS — Z3483 Encounter for supervision of other normal pregnancy, third trimester: Secondary | ICD-10-CM

## 2020-01-26 NOTE — Progress Notes (Signed)
Routine Prenatal Care Visit- Virtual Visit  Subjective   Virtual Visit via Telephone Note  I connected with Vicki Mcdowell on 01/26/20 at  9:15 AM EST by telephone and verified that I am speaking with the correct person using two identifiers.   I discussed the limitations, risks, security and privacy concerns of performing an evaluation and management service by telephone and the availability of in person appointments. I also discussed with the patient that there may be a patient responsible charge related to this service. The patient expressed understanding and agreed to proceed.  The patient was at home I spoke with the patient from my  Office phone The names of people involved in this encounter were: Vicki Mcdowell, her mother and myself [name], CNM  Vicki Mcdowell is a 34 y.o. G1P0000 at [redacted]w[redacted]d having visit today for ongoing prenatal care.  She is currently monitored for the following issues for this low-risk pregnancy and has Supervision of other normal pregnancy, antepartum; Obesity in pregnancy, antepartum, third trimester; Obesity, Class I, BMI 30-34.9; and VP (ventriculoperitoneal) shunt status on their problem list.  ----------------------------------------------------------------------------------- Patient reports respiratory- mostly coughing- symptoms from recently diagnosed covid infection. We reviewed safe medications. Her mother has a question about VP shunt status. Will plan for consult with anesthesia and MD next ROB visit to discuss.  She denies headaches or any other complications related to her VP shunt. Contractions: Not present. Vag. Bleeding: None.  Movement: Present. Denies leaking of fluid.  ----------------------------------------------------------------------------------- The following portions of the patient's history were reviewed and updated as appropriate: allergies, current medications, past family history, past medical history, past social history, past  surgical history and problem list. Problem list updated.   Objective  Height 5\' 4"  (1.626 m), weight 181 lb (82.1 kg), last menstrual period 05/18/2019. Pregravid weight 189 lb (85.7 kg) Total Weight Gain -8 lb (-3.629 kg) Urinalysis:      Fetal Status:     Movement: Present     Physical Exam could not be performed. Because of the COVID-19 outbreak this visit was performed over the phone and not in person.   Assessment   34 y.o. G1P0000 at [redacted]w[redacted]d by  02/22/2020, by Last Menstrual Period presenting for routine prenatal visit  Plan   pregnancy 1 Problems (from 07/08/19 to present)    Problem Noted Resolved   VP (ventriculoperitoneal) shunt status  by 09/07/19, CNM No   Obesity in pregnancy, antepartum, third trimester 01/12/2020 by 01/14/2020, CNM No   Obesity, Class I, BMI 30-34.9 01/12/2020 by 01/14/2020, CNM No   Supervision of other normal pregnancy, antepartum 07/08/2019 by 09/07/2019, MD No   Overview Addendum 12/19/2019  9:25 AM by 12/21/2019, MD     Nursing Staff Provider  Office Location  [facility] Dating   LMP = 7wk [name]  Language  English Anatomy US   incomplete  Flu Vaccine  declines Genetic Screen  NIPS:  Normal gender surprise  TDaP vaccine    Hgb A1C or  GTT Third trimester : 121  Rhogam   not needed   LAB RESULTS   Feeding Plan  Bottle Blood Type A/Positive/-- (06/11 1500)   Contraception  OCP Antibody Negative (06/11 1500)  Circumcision  Rubella <0.90 (06/11 1500)  Pediatrician   RPR Non Reactive (06/11 1500)   Support Person  [name] HBsAg Negative (06/11 1500)   Prenatal Classes  discussed HIV Non Reactive (06/11 1500)    Varicella immune  BTL Consent no GBS  (For PCN allergy, check sensitivities)        VBAC Consent n/a Pap  2019 NIL         Previous Version       Gestational age appropriate obstetric precautions including but not limited to vaginal bleeding, contractions, leaking of fluid and fetal movement were  reviewed in detail with the patient.     Follow Up Instructions: Stay well hydrated, safe medications, bland diet as tolerated Next ROB with MD to discuss VP shunt and possible labor issues Referral to anesthesiology sent to discuss the same   I discussed the assessment and treatment plan with the patient. The patient was provided an opportunity to ask questions and all were answered. The patient agreed with the plan and demonstrated an understanding of the instructions.   The patient was advised to call back or seek an in-person evaluation if the symptoms worsen or if the condition fails to improve as anticipated.  I provided 10 minutes of non-face-to-face time during this encounter.  Return in about 1 week (around 02/02/2020) for rob with MD.   [name], CNM [facility] 01/26/2020, 9:52 AM

## 2020-01-26 NOTE — Progress Notes (Signed)
Telephone visit.  

## 2020-01-27 ENCOUNTER — Emergency Department: Payer: Medicaid Other

## 2020-01-27 ENCOUNTER — Inpatient Hospital Stay
Admission: EM | Admit: 2020-01-27 | Discharge: 2020-02-01 | DRG: 831 | Payer: Medicaid Other | Attending: Obstetrics and Gynecology | Admitting: Obstetrics and Gynecology

## 2020-01-27 ENCOUNTER — Other Ambulatory Visit: Payer: Self-pay

## 2020-01-27 DIAGNOSIS — J9600 Acute respiratory failure, unspecified whether with hypoxia or hypercapnia: Secondary | ICD-10-CM | POA: Diagnosis present

## 2020-01-27 DIAGNOSIS — Z982 Presence of cerebrospinal fluid drainage device: Secondary | ICD-10-CM

## 2020-01-27 DIAGNOSIS — Z3A36 36 weeks gestation of pregnancy: Secondary | ICD-10-CM

## 2020-01-27 DIAGNOSIS — J1282 Pneumonia due to coronavirus disease 2019: Secondary | ICD-10-CM | POA: Diagnosis present

## 2020-01-27 DIAGNOSIS — K8080 Other cholelithiasis without obstruction: Secondary | ICD-10-CM

## 2020-01-27 DIAGNOSIS — O99613 Diseases of the digestive system complicating pregnancy, third trimester: Secondary | ICD-10-CM | POA: Diagnosis present

## 2020-01-27 DIAGNOSIS — Z7901 Long term (current) use of anticoagulants: Secondary | ICD-10-CM | POA: Diagnosis not present

## 2020-01-27 DIAGNOSIS — O26613 Liver and biliary tract disorders in pregnancy, third trimester: Secondary | ICD-10-CM | POA: Diagnosis not present

## 2020-01-27 DIAGNOSIS — U071 COVID-19: Secondary | ICD-10-CM | POA: Diagnosis present

## 2020-01-27 DIAGNOSIS — E669 Obesity, unspecified: Secondary | ICD-10-CM | POA: Diagnosis present

## 2020-01-27 DIAGNOSIS — Z3480 Encounter for supervision of other normal pregnancy, unspecified trimester: Secondary | ICD-10-CM

## 2020-01-27 DIAGNOSIS — O2662 Liver and biliary tract disorders in childbirth: Secondary | ICD-10-CM | POA: Diagnosis not present

## 2020-01-27 DIAGNOSIS — R0602 Shortness of breath: Secondary | ICD-10-CM

## 2020-01-27 DIAGNOSIS — R7989 Other specified abnormal findings of blood chemistry: Secondary | ICD-10-CM

## 2020-01-27 DIAGNOSIS — O98513 Other viral diseases complicating pregnancy, third trimester: Principal | ICD-10-CM | POA: Diagnosis present

## 2020-01-27 DIAGNOSIS — O99353 Diseases of the nervous system complicating pregnancy, third trimester: Secondary | ICD-10-CM | POA: Diagnosis not present

## 2020-01-27 DIAGNOSIS — R7401 Elevation of levels of liver transaminase levels: Secondary | ICD-10-CM | POA: Diagnosis not present

## 2020-01-27 DIAGNOSIS — O99513 Diseases of the respiratory system complicating pregnancy, third trimester: Secondary | ICD-10-CM | POA: Diagnosis present

## 2020-01-27 DIAGNOSIS — O9A213 Injury, poisoning and certain other consequences of external causes complicating pregnancy, third trimester: Secondary | ICD-10-CM

## 2020-01-27 DIAGNOSIS — O368330 Maternal care for abnormalities of the fetal heart rate or rhythm, third trimester, not applicable or unspecified: Secondary | ICD-10-CM | POA: Diagnosis present

## 2020-01-27 DIAGNOSIS — R0600 Dyspnea, unspecified: Secondary | ICD-10-CM | POA: Diagnosis not present

## 2020-01-27 DIAGNOSIS — O99213 Obesity complicating pregnancy, third trimester: Secondary | ICD-10-CM | POA: Diagnosis present

## 2020-01-27 DIAGNOSIS — O9852 Other viral diseases complicating childbirth: Secondary | ICD-10-CM | POA: Diagnosis present

## 2020-01-27 DIAGNOSIS — G919 Hydrocephalus, unspecified: Secondary | ICD-10-CM | POA: Diagnosis not present

## 2020-01-27 DIAGNOSIS — Z3A37 37 weeks gestation of pregnancy: Secondary | ICD-10-CM | POA: Diagnosis not present

## 2020-01-27 DIAGNOSIS — T50B95A Adverse effect of other viral vaccines, initial encounter: Secondary | ICD-10-CM | POA: Diagnosis not present

## 2020-01-27 DIAGNOSIS — J9601 Acute respiratory failure with hypoxia: Secondary | ICD-10-CM | POA: Diagnosis not present

## 2020-01-27 DIAGNOSIS — Z8669 Personal history of other diseases of the nervous system and sense organs: Secondary | ICD-10-CM | POA: Diagnosis not present

## 2020-01-27 DIAGNOSIS — O9952 Diseases of the respiratory system complicating childbirth: Secondary | ICD-10-CM | POA: Diagnosis not present

## 2020-01-27 DIAGNOSIS — O99214 Obesity complicating childbirth: Secondary | ICD-10-CM | POA: Diagnosis not present

## 2020-01-27 DIAGNOSIS — R1011 Right upper quadrant pain: Secondary | ICD-10-CM

## 2020-01-27 DIAGNOSIS — K8020 Calculus of gallbladder without cholecystitis without obstruction: Secondary | ICD-10-CM | POA: Diagnosis present

## 2020-01-27 LAB — HEPATIC FUNCTION PANEL
ALT: 162 U/L — ABNORMAL HIGH (ref 0–44)
AST: 274 U/L — ABNORMAL HIGH (ref 15–41)
Albumin: 2.7 g/dL — ABNORMAL LOW (ref 3.5–5.0)
Alkaline Phosphatase: 160 U/L — ABNORMAL HIGH (ref 38–126)
Bilirubin, Direct: 1 mg/dL — ABNORMAL HIGH (ref 0.0–0.2)
Indirect Bilirubin: 0.9 mg/dL (ref 0.3–0.9)
Total Bilirubin: 1.9 mg/dL — ABNORMAL HIGH (ref 0.3–1.2)
Total Protein: 6.7 g/dL (ref 6.5–8.1)

## 2020-01-27 LAB — BASIC METABOLIC PANEL
Anion gap: 13 (ref 5–15)
BUN: 9 mg/dL (ref 6–20)
CO2: 15 mmol/L — ABNORMAL LOW (ref 22–32)
Calcium: 8.4 mg/dL — ABNORMAL LOW (ref 8.9–10.3)
Chloride: 103 mmol/L (ref 98–111)
Creatinine, Ser: 0.79 mg/dL (ref 0.44–1.00)
GFR, Estimated: >60 mL/min (ref >60)
Glucose, Bld: 112 mg/dL — ABNORMAL HIGH (ref 70–99)
Potassium: 3.8 mmol/L (ref 3.5–5.1)
Sodium: 131 mmol/L — ABNORMAL LOW (ref 135–145)

## 2020-01-27 LAB — CBC
HCT: 42.8 % (ref 36.0–46.0)
Hemoglobin: 14.6 g/dL (ref 12.0–15.0)
MCH: 29.6 pg (ref 26.0–34.0)
MCHC: 34.1 g/dL (ref 30.0–36.0)
MCV: 86.8 fL (ref 80.0–100.0)
Platelets: 207 10*3/uL (ref 150–400)
RBC: 4.93 MIL/uL (ref 3.87–5.11)
RDW: 12.7 % (ref 11.5–15.5)
WBC: 6.5 10*3/uL (ref 4.0–10.5)
nRBC: 0 % (ref 0.0–0.2)

## 2020-01-27 LAB — TROPONIN I (HIGH SENSITIVITY)
Troponin I (High Sensitivity): 4 ng/L (ref <18)
Troponin I (High Sensitivity): 5 ng/L (ref <18)

## 2020-01-27 LAB — PROCALCITONIN: Procalcitonin: <0.1 ng/mL

## 2020-01-27 LAB — LACTIC ACID, PLASMA
Lactic Acid, Venous: 1.9 mmol/L (ref 0.5–1.9)
Lactic Acid, Venous: 1.5 mmol/L (ref 0.5–1.9)

## 2020-01-27 MED ORDER — LACTATED RINGERS IV SOLN: INTRAVENOUS | Status: DC

## 2020-01-27 MED ORDER — LACTATED RINGERS IV BOLUS
1000.0000 mL | Freq: Once | INTRAVENOUS | Status: AC
  Administered 2020-01-27: 1000 mL via INTRAVENOUS

## 2020-01-27 MED ORDER — ONDANSETRON HCL 4 MG/2ML IJ SOLN
4.0000 mg | Freq: Four times a day (QID) | INTRAMUSCULAR | Status: DC | PRN
  Administered 2020-01-27 – 2020-01-28 (×3): 4 mg via INTRAVENOUS
  Filled 2020-01-27 (×3): qty 2

## 2020-01-27 MED ORDER — ONDANSETRON 4 MG PO TBDP: 4.0000 mg | ORAL_TABLET | Freq: Four times a day (QID) | ORAL | Status: DC | PRN

## 2020-01-27 NOTE — H&P (Addendum)
OB/GYN History & Physical   History of Present Illness:  Chief Complaint: shortness of breath, diagnosed with COVID19  HPI:  Vicki Mcdowell is a 34 y.o. G1P0000 female at [redacted]w[redacted]d dated by LMP c/w with 7 week ultrasound.  Her pregnancy has been complicated by mild obesity, history of hydrocephalus with VP shunt. .    She denies contractions.   She denies leakage of fluid.   She denies vaginal bleeding.   She reports fetal movement.    She started having symptoms of shortness of breath 2 days ago and this worsened to the point where the patient felt concerned and came to the ER.  She denies fevers and chills. She has had anorexia. She has been able to tolerate food when she tries. However, she doesn't feel particularly hungry and hasn't eaten much or had much to drink. She denies headaches.  She denies visual changes, RUQ pain.  She denies GI symptoms.  She states that her entire household has COVID19.    Total weight gain for pregnancy: -3.629 kg   Obstetrical Problem List: pregnancy 1 Problems (from 07/08/19 to present)    Problem Noted Resolved   COVID-19 affecting pregnancy in third trimester 01/27/2020 by Conard Novak, MD No   Transaminitis 01/27/2020 by Conard Novak, MD No   Pneumonia due to COVID-19 virus 01/27/2020 by Conard Novak, MD No   Shortness of breath after COVID-19 vaccination 01/27/2020 by Conard Novak, MD No   VP (ventriculoperitoneal) shunt status  by [name], CNM No   Obesity in pregnancy, antepartum, third trimester 01/12/2020 by Zipporah Plants, CNM No   Obesity, Class I, BMI 30-34.9 01/12/2020 by Zipporah Plants, CNM No   Supervision of other normal pregnancy, antepartum 07/08/2019 by [name], MD No   Overview Addendum 12/19/2019  9:25 AM by [name], MD     Nursing Staff Provider  Office Location  [facility] Dating   LMP = 7wk [place]  Language  English Anatomy US   incomplete  Flu Vaccine  declines Genetic Screen   NIPS:  Normal gender surprise  TDaP vaccine    Hgb A1C or  GTT Third trimester : 121  Rhogam   not needed   LAB RESULTS   Feeding Plan  Bottle Blood Type A/Positive/-- (06/11 1500)   Contraception  OCP Antibody Negative (06/11 1500)  Circumcision  Rubella <0.90 (06/11 1500)  Pediatrician   RPR Non Reactive (06/11 1500)   Support Person  [name] HBsAg Negative (06/11 1500)   Prenatal Classes  discussed HIV Non Reactive (06/11 1500)    Varicella immune   BTL Consent no GBS  (For PCN allergy, check sensitivities)        VBAC Consent n/a Pap  2019 NIL         Previous Version       Maternal Medical History:   Past Medical History:  Diagnosis Date  . VP (ventriculoperitoneal) shunt status     Past Surgical History:  Procedure Laterality Date  . DG HYSTEROGRAM (HSG)  10/28/2018      . hydroceph    . SHUNT REVISION VENTRICULAR-PERITONEAL      No Known Allergies  Prior to Admission medications   Medication Sig Start Date End Date Taking? Authorizing Provider  ondansetron (ZOFRAN ODT) 4 MG disintegrating tablet Take 1 tablet (4 mg total) by mouth every 6 (six) hours as needed for nausea. 09/12/19  Yes Schuman, Jaquelyn Bitter, MD    OB History  Gravida Para Term Preterm AB Living  1 0 0 0 0 0  SAB IAB Ectopic Multiple Live Births  0 0 0 0 0    # Outcome Date GA Lbr Len/2nd Weight Sex Delivery Anes PTL Lv  1 Current             Prenatal care site: [facility]  Social History: She  reports that she has never smoked. She has never used smokeless tobacco. She reports that she does not drink alcohol and does not use drugs.  Family History: She denies a family of gynecologic cancers  Review of Systems:  Review of Systems  Constitutional: Positive for malaise/fatigue. Negative for chills and fever.  HENT: Negative.  Negative for congestion and sore throat.   Eyes: Negative.  Negative for blurred vision and double vision.  Respiratory: Positive for cough, sputum  production and shortness of breath. Negative for hemoptysis and wheezing.   Cardiovascular: Negative.  Negative for chest pain.  Gastrointestinal: Positive for nausea. Negative for abdominal pain, constipation, diarrhea and vomiting.  Genitourinary: Negative.   Musculoskeletal: Positive for myalgias.  Skin: Negative.   Neurological: Negative.  Negative for loss of consciousness, weakness and headaches.  Psychiatric/Behavioral: Negative.      Physical Exam:  BP 117/73   Pulse 98   Temp 98.8 F (37.1 C) (Oral)   Resp (!) 40   LMP 05/18/2019   SpO2 98%   Physical Exam Constitutional:      General: She is not in acute distress.    Appearance: Normal appearance. She is well-developed.  HENT:     Head: Normocephalic and atraumatic.  Eyes:     General: No scleral icterus.    Conjunctiva/sclera: Conjunctivae normal.  Cardiovascular:     Rate and Rhythm: Regular rhythm. Tachycardia present.     Heart sounds: No murmur heard. No friction rub. No gallop.   Pulmonary:     Effort: Pulmonary effort is normal. No respiratory distress.     Breath sounds: Rales present. No wheezing.  Abdominal:     General: Bowel sounds are normal. There is no distension.     Palpations: Abdomen is soft. There is mass (gravid, NT, soft).     Tenderness: There is no abdominal tenderness. There is no guarding or rebound.  Musculoskeletal:        General: Normal range of motion.     Cervical back: Normal range of motion and neck supple.     Right lower leg: No edema.     Left lower leg: No edema.  Neurological:     General: No focal deficit present.     Mental Status: She is alert and oriented to person, place, and time.     Cranial Nerves: No cranial nerve deficit.  Skin:    General: Skin is warm and dry.     Findings: No erythema.  Psychiatric:        Mood and Affect: Mood normal.        Behavior: Behavior normal. Behavior is not agitated.        Judgment: Judgment normal.     Fetal  tracing: Baseline FHR: 150 beats/min   Variability: moderate   Accelerations: present   Decelerations: absent Contractions: present frequency: 1-2 q 10 min Overall assessment: cat 1  Lab Results  Component Value Date   PROCALCITON <0.10 01/27/2020   AST 274 (H) 01/27/2020   ALT 162 (H) 01/27/2020   BILITOT 1.9 (H) 01/27/2020    CBC Latest Ref  Rng & Units 01/27/2020  WBC 4.0 - 10.5 K/uL 6.5  Hemoglobin 12.0 - 15.0 g/dL 10.6  Hematocrit 26.9 - 46.0 % 42.8  Platelets 150 - 400 K/uL 207   Lab Results  Component Value Date   NA 131 (L) 01/27/2020   K 3.8 01/27/2020   CREATININE 0.79 01/27/2020   GLUCOSE 112 (H) 01/27/2020    Lab Results  Component Value Date   LATICACIDVEN 1.5 01/27/2020   LATICACIDVEN 1.9 01/27/2020    DG Chest Port 1 View  Result Date: 01/27/2020 CLINICAL DATA:  Shortness of breath COVID positive [redacted] weeks pregnant EXAM: PORTABLE CHEST 1 VIEW COMPARISON:  02/02/2012 FINDINGS: Right-sided shunt catheter. Mild patchy foci of airspace disease bilaterally. Normal heart size. No pneumothorax. IMPRESSION: Mild patchy foci of airspace disease bilaterally, suspect for multifocal pneumonia. Electronically Signed   By: Jasmine Pang M.D.   On: 01/27/2020 18:21   US Abdomen Limited RUQ (LIVER/GB)  Result Date: 01/27/2020 CLINICAL DATA:  Right upper quadrant pain and chest pain. COVID positive. Pregnant. EXAM: ULTRASOUND ABDOMEN LIMITED RIGHT UPPER QUADRANT COMPARISON:  None. FINDINGS: Gallbladder: Cholelithiasis with multiple small stones demonstrated in the gallbladder. No gallbladder wall thickening or edema. Murphy's sign is negative. Common bile duct: Diameter: 2.7 mm, normal Liver: No focal lesion identified. Within normal limits in parenchymal echogenicity. Portal vein is patent on color Doppler imaging with normal direction of blood flow towards the liver. Other: None. IMPRESSION: Cholelithiasis. No evidence of cholecystitis. Electronically Signed   By: Burman Nieves  M.D.   On: 01/27/2020 19:42    Assessment:  Vicki Mcdowell is a 34 y.o. G1P0000 female at [redacted]w[redacted]d with COVID19 pneumonia. She meets criteria for moderate covid19 disease by pneumonia on CXR.    Plan:  1. Admit to Labor & Delivery  2. CBC, T&S, regular diet, IVF 3. [place]etwal well-being: reassuring 4. COVID19: will consult hospitalist.  Will recommend treatment from OB standpoint based on [facility] recommendations "https://s3.amazonaws.com/cdn.smfm.org/media/2734/[facility]_COVID_Management_of_COVID_pos_preg_patients_2-2-21_(final).pdf" 5. Mainly will provide supportive care and provide care per hospitalist recommendations. Goal O2 saturation is >94% in pregnancy.  Patient does not require oxygen at this point, though she does have subjective trouble breathing.   Thomasene Mohair, MD 01/27/2020 11:24 PM

## 2020-01-27 NOTE — ED Triage Notes (Signed)
FIRST NURSE NOTE: Pt here with COVID sxs, dx + on Monday, [redacted] weeks pregnant, no pregnancy complaints on arrival.

## 2020-01-27 NOTE — ED Triage Notes (Signed)
Pt presents via POV c/o increased SOB. + Covid test on Monday. Reports worsening symptoms. 36wks OB. + FM per pt.

## 2020-01-27 NOTE — ED Provider Notes (Signed)
[facility] Emergency Department Provider Note  ____________________________________________   Event Date/Time   First MD Initiated Contact with Patient 01/27/20 1746     (approximate)  I have reviewed the triage vital signs and the nursing notes.   HISTORY  Chief Complaint Shortness of Breath    HPI Vicki Mcdowell is a 34 y.o. female  With h/o obesity, here with cough, shortness of breath, fatigue. Pt is G1P0 at estimated [redacted] wk GA, with pregnancy c/b h/o hydrocephalus s/p VP shunt. She was recently diagnosed with COVID 2 days ago, was sick 4-5 days ago. She has had mild worsening SOB recently but also markedly poor appetite and PO intake, with increasing fatigue. She has had associated weakness, nausea, but no vomiting. Denies any abdominal pain, LOF, or vaginal bleeding. No CTX. FM is normal. She has had a cough sicne diagnosis but denies any sputum production. Here 2/2 her weakness, loss of appetite, and now SOB.        Past Medical History:  Diagnosis Date  . VP (ventriculoperitoneal) shunt status     Patient Active Problem List   Diagnosis Date Noted  . COVID-19 affecting pregnancy in third trimester 01/27/2020  . Transaminitis 01/27/2020  . Pneumonia due to COVID-19 virus 01/27/2020  . Shortness of breath after COVID-19 vaccination 01/27/2020  . VP (ventriculoperitoneal) shunt status   . Obesity in pregnancy, antepartum, third trimester 01/12/2020  . Obesity, Class I, BMI 30-34.9 01/12/2020  . Supervision of other normal pregnancy, antepartum 07/08/2019    Past Surgical History:  Procedure Laterality Date  . DG HYSTEROGRAM (HSG)  10/28/2018      . hydroceph    . SHUNT REVISION VENTRICULAR-PERITONEAL      Prior to Admission medications   Medication Sig Start Date End Date Taking? Authorizing Provider  ondansetron (ZOFRAN ODT) 4 MG disintegrating tablet Take 1 tablet (4 mg total) by mouth every 6 (six) hours as needed for nausea.  09/12/19  Yes Schuman, Jaquelyn Bitter, MD    Allergies Patient has no known allergies.  History reviewed. No pertinent family history.  Social History Social History   Tobacco Use  . Smoking status: Never Smoker  . Smokeless tobacco: Never Used  Vaping Use  . Vaping Use: Never used  Substance Use Topics  . Alcohol use: Never  . Drug use: No    Review of Systems  Review of Systems  Constitutional: Positive for fatigue. Negative for fever.  HENT: Negative for congestion and sore throat.   Eyes: Negative for visual disturbance.  Respiratory: Positive for cough and shortness of breath.   Cardiovascular: Negative for chest pain.  Gastrointestinal: Positive for nausea. Negative for abdominal pain, diarrhea and vomiting.  Genitourinary: Negative for flank pain.  Musculoskeletal: Negative for back pain and neck pain.  Skin: Negative for rash and wound.  Neurological: Positive for weakness.  All other systems reviewed and are negative.    ____________________________________________  PHYSICAL EXAM:      VITAL SIGNS: ED Triage Vitals [01/27/20 1635]  Enc Vitals Group     BP 128/77     Pulse Rate (!) 117     Resp (!) 24     Temp 98.8 F (37.1 C)     Temp Source Oral     SpO2 98 %     Weight      Height      Head Circumference      Peak Flow      Pain Score 0  Pain Loc      Pain Edu?      Excl. in GC?      Physical Exam Vitals and nursing note reviewed.  Constitutional:      General: She is not in acute distress.    Appearance: She is well-developed.  HENT:     Head: Normocephalic and atraumatic.     Mouth/Throat:     Comments: Dry MM Eyes:     Conjunctiva/sclera: Conjunctivae normal.  Cardiovascular:     Rate and Rhythm: Normal rate and regular rhythm.     Heart sounds: Normal heart sounds. No murmur heard. No friction rub.  Pulmonary:     Effort: Pulmonary effort is normal. No respiratory distress.     Breath sounds: Examination of the right-lower  field reveals rales. Examination of the left-lower field reveals rales. Rales present. No wheezing.  Abdominal:     General: There is no distension.     Palpations: Abdomen is soft.     Tenderness: There is no abdominal tenderness.     Comments: Gravid, fetal movements appreciated.  No tenderness  Musculoskeletal:     Cervical back: Neck supple.  Skin:    General: Skin is warm.     Capillary Refill: Capillary refill takes less than 2 seconds.  Neurological:     Mental Status: She is alert and oriented to person, place, and time.     Motor: No abnormal muscle tone.       ____________________________________________   LABS (all labs ordered are listed, but only abnormal results are displayed)  Labs Reviewed  BASIC METABOLIC PANEL - Abnormal; Notable for the following components:      Result Value   Sodium 131 (*)    CO2 15 (*)    Glucose, Bld 112 (*)    Calcium 8.4 (*)    All other components within normal limits  HEPATIC FUNCTION PANEL - Abnormal; Notable for the following components:   Albumin 2.7 (*)    AST 274 (*)    ALT 162 (*)    Alkaline Phosphatase 160 (*)    Total Bilirubin 1.9 (*)    Bilirubin, Direct 1.0 (*)    All other components within normal limits  CULTURE, BLOOD (ROUTINE X 2)  CULTURE, BLOOD (ROUTINE X 2)  CBC  PROCALCITONIN  LACTIC ACID, PLASMA  LACTIC ACID, PLASMA  TROPONIN I (HIGH SENSITIVITY)  TROPONIN I (HIGH SENSITIVITY)    ____________________________________________  EKG: Normal sinus rhythm, ventricular rate 100.  QRS 88, QTc 475.  No acute ST elevation depression.  No acute ischemic changes. ________________________________________  RADIOLOGY All imaging, including plain films, CT scans, and ultrasounds, independently reviewed by me, and interpretations confirmed via formal radiology reads.  ED MD interpretation:   CXR: Multifocal PNA OB/GYN ultrasound: Cholelithiasis, no cholecystitis  Official radiology report(s): DG Chest Port  1 View  Result Date: 01/27/2020 CLINICAL DATA:  Shortness of breath COVID positive [redacted] weeks pregnant EXAM: PORTABLE CHEST 1 VIEW COMPARISON:  02/02/2012 FINDINGS: Right-sided shunt catheter. Mild patchy foci of airspace disease bilaterally. Normal heart size. No pneumothorax. IMPRESSION: Mild patchy foci of airspace disease bilaterally, suspect for multifocal pneumonia. Electronically Signed   By: Jasmine Pang M.D.   On: 01/27/2020 18:21   US Abdomen Limited RUQ (LIVER/GB)  Result Date: 01/27/2020 CLINICAL DATA:  Right upper quadrant pain and chest pain. COVID positive. Pregnant. EXAM: ULTRASOUND ABDOMEN LIMITED RIGHT UPPER QUADRANT COMPARISON:  None. FINDINGS: Gallbladder: Cholelithiasis with multiple small stones demonstrated in the gallbladder.  No gallbladder wall thickening or edema. Murphy's sign is negative. Common bile duct: Diameter: 2.7 mm, normal Liver: No focal lesion identified. Within normal limits in parenchymal echogenicity. Portal vein is patent on color Doppler imaging with normal direction of blood flow towards the liver. Other: None. IMPRESSION: Cholelithiasis. No evidence of cholecystitis. Electronically Signed   By: Burman Nieves M.D.   On: 01/27/2020 19:42    ____________________________________________  PROCEDURES   Procedure(s) performed (including Critical Care):  Procedures  ____________________________________________  INITIAL IMPRESSION / MDM / ASSESSMENT AND PLAN / ED COURSE  As part of my medical decision making, I reviewed the following data within the electronic MEDICAL RECORD NUMBER Nursing notes reviewed and incorporated, Old chart reviewed, Notes from prior ED visits, and [place] Controlled Substance Database       *FRIMY UFFELMAN was evaluated in Emergency Department on 01/27/2020 for the symptoms described in the history of present illness. She was evaluated in the context of the global COVID-19 pandemic, which necessitated consideration that the patient  might be at risk for infection with the SARS-CoV-2 virus that causes COVID-19. Institutional protocols and algorithms that pertain to the evaluation of patients at risk for COVID-19 are in a state of rapid change based on information released by regulatory bodies including the CDC and federal and state organizations. These policies and algorithms were followed during the patient's care in the ED.  Some ED evaluations and interventions may be delayed as a result of limited staffing during the pandemic.*     Medical Decision Making:  34 yo F here with SOB, fatigue in setting of COVID-19.  She is currently [redacted] weeks pregnant.  Clinically, the patient is nontoxic.  She is satting greater than 95% on room air, even with exertion.  Chest x-ray does show multifocal pneumonia consistent with COVID-19.  Lab work reviewed, is most concerning for moderate transaminitis and elevated bilirubin likely related to her dehydration and viral hepatitis.  Ultrasound obtained, shows no evidence of cholecystitis or other abnormalities other than cholelithiasis.  Clinically, she has no tenderness here and no signs to suggest a cholecystitis.  Believe this is all related to COVID-19.  Will admit for fluids, rehydration, and trending of labs.  Dr. Jean Rosenthal will to admit.  ____________________________________________  FINAL CLINICAL IMPRESSION(S) / ED DIAGNOSES  Final diagnoses:  RUQ pain  COVID-19 affecting pregnancy in third trimester  Transaminitis     MEDICATIONS GIVEN DURING THIS VISIT:  Medications  ondansetron (ZOFRAN-ODT) disintegrating tablet 4 mg (has no administration in time range)  lactated ringers bolus 1,000 mL (0 mLs Intravenous Stopped 01/27/20 1923)  lactated ringers bolus 1,000 mL (1,000 mLs Intravenous New Bag/Given 01/27/20 1935)     ED Discharge Orders    None       Note:  This document was prepared using Dragon voice recognition software and may include unintentional dictation errors.    Shaune Pollack, MD 01/27/20 2124

## 2020-01-28 DIAGNOSIS — Z3A36 36 weeks gestation of pregnancy: Secondary | ICD-10-CM

## 2020-01-28 DIAGNOSIS — O99353 Diseases of the nervous system complicating pregnancy, third trimester: Secondary | ICD-10-CM

## 2020-01-28 DIAGNOSIS — J1282 Pneumonia due to coronavirus disease 2019: Secondary | ICD-10-CM

## 2020-01-28 DIAGNOSIS — K8080 Other cholelithiasis without obstruction: Secondary | ICD-10-CM

## 2020-01-28 DIAGNOSIS — O26613 Liver and biliary tract disorders in pregnancy, third trimester: Secondary | ICD-10-CM

## 2020-01-28 DIAGNOSIS — O98513 Other viral diseases complicating pregnancy, third trimester: Secondary | ICD-10-CM

## 2020-01-28 DIAGNOSIS — O99213 Obesity complicating pregnancy, third trimester: Secondary | ICD-10-CM

## 2020-01-28 DIAGNOSIS — R7401 Elevation of levels of liver transaminase levels: Secondary | ICD-10-CM

## 2020-01-28 DIAGNOSIS — U071 COVID-19: Secondary | ICD-10-CM

## 2020-01-28 DIAGNOSIS — Z982 Presence of cerebrospinal fluid drainage device: Secondary | ICD-10-CM

## 2020-01-28 DIAGNOSIS — G919 Hydrocephalus, unspecified: Secondary | ICD-10-CM

## 2020-01-28 LAB — CBC
HCT: 35.2 % — ABNORMAL LOW (ref 36.0–46.0)
HCT: 34.4 % — ABNORMAL LOW (ref 36.0–46.0)
Hemoglobin: 12.2 g/dL (ref 12.0–15.0)
Hemoglobin: 11.8 g/dL — ABNORMAL LOW (ref 12.0–15.0)
MCH: 29.9 pg (ref 26.0–34.0)
MCH: 29.5 pg (ref 26.0–34.0)
MCHC: 34.7 g/dL (ref 30.0–36.0)
MCHC: 34.3 g/dL (ref 30.0–36.0)
MCV: 86.3 fL (ref 80.0–100.0)
MCV: 86 fL (ref 80.0–100.0)
Platelets: 195 10*3/uL (ref 150–400)
Platelets: 188 10*3/uL (ref 150–400)
RBC: 4.08 MIL/uL (ref 3.87–5.11)
RBC: 4 MIL/uL (ref 3.87–5.11)
RDW: 13 % (ref 11.5–15.5)
RDW: 12.8 % (ref 11.5–15.5)
WBC: 6.8 10*3/uL (ref 4.0–10.5)
WBC: 6.6 10*3/uL (ref 4.0–10.5)
nRBC: 0 % (ref 0.0–0.2)
nRBC: 0.3 % — ABNORMAL HIGH (ref 0.0–0.2)

## 2020-01-28 LAB — COMPREHENSIVE METABOLIC PANEL
ALT: 160 U/L — ABNORMAL HIGH (ref 0–44)
AST: 256 U/L — ABNORMAL HIGH (ref 15–41)
Albumin: 2.1 g/dL — ABNORMAL LOW (ref 3.5–5.0)
Alkaline Phosphatase: 134 U/L — ABNORMAL HIGH (ref 38–126)
Anion gap: 11 (ref 5–15)
BUN: 8 mg/dL (ref 6–20)
CO2: 18 mmol/L — ABNORMAL LOW (ref 22–32)
Calcium: 7.9 mg/dL — ABNORMAL LOW (ref 8.9–10.3)
Chloride: 103 mmol/L (ref 98–111)
Creatinine, Ser: 0.73 mg/dL (ref 0.44–1.00)
GFR, Estimated: >60 mL/min (ref >60)
Glucose, Bld: 93 mg/dL (ref 70–99)
Potassium: 3.6 mmol/L (ref 3.5–5.1)
Sodium: 132 mmol/L — ABNORMAL LOW (ref 135–145)
Total Bilirubin: 2 mg/dL — ABNORMAL HIGH (ref 0.3–1.2)
Total Protein: 5.3 g/dL — ABNORMAL LOW (ref 6.5–8.1)

## 2020-01-28 LAB — MAGNESIUM: Magnesium: 1.7 mg/dL (ref 1.7–2.4)

## 2020-01-28 LAB — HIV ANTIBODY (ROUTINE TESTING W REFLEX): HIV Screen 4th Generation wRfx: NONREACTIVE

## 2020-01-28 LAB — CREATININE, SERUM
Creatinine, Ser: 0.71 mg/dL (ref 0.44–1.00)
GFR, Estimated: >60 mL/min (ref >60)

## 2020-01-28 LAB — OB RESULTS CONSOLE GBS: GBS: NEGATIVE

## 2020-01-28 MED ORDER — ASCORBIC ACID 500 MG PO TABS
250.0000 mg | ORAL_TABLET | Freq: Every day | ORAL | Status: DC
  Administered 2020-01-28 – 2020-02-01 (×5): 250 mg via ORAL
  Filled 2020-01-28 (×2): qty 1
  Filled 2020-01-28 (×3): qty 0.5

## 2020-01-28 MED ORDER — GUAIFENESIN-DM 100-10 MG/5ML PO SYRP
10.0000 mL | ORAL_SOLUTION | ORAL | Status: DC | PRN
  Administered 2020-01-28 – 2020-01-31 (×6): 10 mL via ORAL
  Filled 2020-01-28 (×16): qty 10

## 2020-01-28 MED ORDER — SODIUM CHLORIDE 0.9 % IV SOLN
100.0000 mg | Freq: Every day | INTRAVENOUS | Status: AC
  Administered 2020-01-29 – 2020-02-01 (×4): 100 mg via INTRAVENOUS
  Filled 2020-01-28 (×2): qty 20
  Filled 2020-01-28 (×2): qty 100

## 2020-01-28 MED ORDER — HYDROCOD POLST-CPM POLST ER 10-8 MG/5ML PO SUER
5.0000 mL | Freq: Two times a day (BID) | ORAL | Status: DC | PRN
  Administered 2020-01-28 – 2020-01-30 (×3): 5 mL via ORAL
  Filled 2020-01-28 (×3): qty 5

## 2020-01-28 MED ORDER — ZINC SULFATE 220 (50 ZN) MG PO CAPS
220.0000 mg | ORAL_CAPSULE | Freq: Every day | ORAL | Status: DC
  Administered 2020-01-28 – 2020-02-01 (×5): 220 mg via ORAL
  Filled 2020-01-28 (×5): qty 1

## 2020-01-28 MED ORDER — SODIUM CHLORIDE 0.9 % IV SOLN: INTRAVENOUS | Status: DC

## 2020-01-28 MED ORDER — ENOXAPARIN SODIUM 40 MG/0.4ML ~~LOC~~ SOLN
40.0000 mg | SUBCUTANEOUS | Status: DC
  Administered 2020-01-28 – 2020-02-01 (×5): 40 mg via SUBCUTANEOUS
  Filled 2020-01-28 (×5): qty 0.4

## 2020-01-28 MED ORDER — ACETAMINOPHEN 325 MG PO TABS
650.0000 mg | ORAL_TABLET | Freq: Four times a day (QID) | ORAL | Status: DC | PRN
  Administered 2020-01-30 – 2020-01-31 (×3): 650 mg via ORAL
  Filled 2020-01-28 (×5): qty 2

## 2020-01-28 MED ORDER — SODIUM CHLORIDE 0.9 % IV SOLN
200.0000 mg | Freq: Once | INTRAVENOUS | Status: AC
  Administered 2020-01-28: 200 mg via INTRAVENOUS
  Filled 2020-01-28: qty 200

## 2020-01-28 MED ORDER — DEXAMETHASONE SODIUM PHOSPHATE 10 MG/ML IJ SOLN
6.0000 mg | INTRAMUSCULAR | Status: DC
  Administered 2020-01-28 – 2020-02-01 (×5): 6 mg via INTRAVENOUS
  Filled 2020-01-28 (×5): qty 0.6

## 2020-01-28 NOTE — Progress Notes (Signed)
PROGRESS NOTE    Vicki Mcdowell  KZS:[id number] DOB: Jul 25, 1985 DOA: 01/27/2020 PCP: Ailene Ravel, MD  Assessment & Plan:   Principal Problem:   COVID-19 affecting pregnancy in third trimester Active Problems:   Supervision of other normal pregnancy, antepartum   Obesity in pregnancy, antepartum, third trimester   VP (ventriculoperitoneal) shunt status   Transaminitis   Pneumonia due to COVID-19 virus   Shortness of breath after COVID-19 vaccination   COVID-19 pneumonia: continue on airborne & contact precautions. Unvaccinated. No indication for steroids, remdesivir given no oxygen requirement. Tussionex prn for cough as per OBGYN. Continue w/ supportive care   Transaminitis: likely secondary to COVID19. Will continue to monitor    Hyponatremia: trending up today. Will continue to monitor  Pregnancy: management by primary team/OBGYN  Obesity: BMI 32.7. Complicates overall care    DVT prophylaxis: SCDs Code Status: full  Family Communication:  Disposition Plan: likely d/c back home    Status is: Inpatient  Remains inpatient appropriate because:Inpatient level of care appropriate due to severity of illness   Dispo: The patient is from: Home              Anticipated d/c is to: Home              Anticipated d/c date is: as per primary team              Patient currently is not medically stable to d/c.        Consultants:  Hospitalist    Procedures:    Antimicrobials:    Subjective: Pt c/o cough   Objective: Vitals:   01/28/20 1029 01/28/20 1034 01/28/20 1039 01/28/20 1119  BP:      Pulse:      Resp:    (!) 24  Temp:    99.1 F (37.3 C)  TempSrc:    Oral  SpO2: 94% 95% 94%   Weight:      Height:       No intake or output data in the 24 hours ending 01/28/20 1357 Filed Weights   01/27/20 2327  Weight: 86.6 kg    Examination:  General exam: Appears calm and comfortable  Respiratory system: diminished breath sounds  b/l. Cardiovascular system: S1 & S2 +. No rubs, gallops or clicks.  Gastrointestinal system: Abdomen is pregnant, soft and nontender. Hypoactive bowel sounds heard. Central nervous system: Alert and oriented. Moves all 4 extremities  Psychiatry: Judgement and insight appear normal. Mood & affect appropriate.     Data Reviewed: I have personally reviewed following labs and imaging studies  CBC: Recent Labs  Lab 01/27/20 1638 01/28/20 0606  WBC 6.5 6.8  HGB 14.6 12.2  HCT 42.8 35.2*  MCV 86.8 86.3  PLT 207 195   Basic Metabolic Panel: Recent Labs  Lab 01/27/20 1638 01/27/20 1931 01/28/20 0606  NA 131*  --  132*  K 3.8  --  3.6  CL 103  --  103  CO2 15*  --  18*  GLUCOSE 112*  --  93  BUN 9  --  8  CREATININE 0.79  --  0.73  CALCIUM 8.4*  --  7.9*  MG  --  1.7  --    GFR: Estimated Creatinine Clearance: 105.6 mL/min (by C-G formula based on SCr of 0.73 mg/dL). Liver Function Tests: Recent Labs  Lab 01/27/20 1638 01/28/20 0606  AST 274* 256*  ALT 162* 160*  ALKPHOS 160* 134*  BILITOT 1.9* 2.0*  PROT  6.7 5.3*  ALBUMIN 2.7* 2.1*   No results for input(s): LIPASE, AMYLASE in the last 168 hours. No results for input(s): AMMONIA in the last 168 hours. Coagulation Profile: No results for input(s): INR, PROTIME in the last 168 hours. Cardiac Enzymes: No results for input(s): CKTOTAL, CKMB, CKMBINDEX, TROPONINI in the last 168 hours. BNP (last 3 results) No results for input(s): PROBNP in the last 8760 hours. HbA1C: No results for input(s): HGBA1C in the last 72 hours. CBG: No results for input(s): GLUCAP in the last 168 hours. Lipid Profile: No results for input(s): CHOL, HDL, LDLCALC, TRIG, CHOLHDL, LDLDIRECT in the last 72 hours. Thyroid Function Tests: No results for input(s): TSH, T4TOTAL, FREET4, T3FREE, THYROIDAB in the last 72 hours. Anemia Panel: No results for input(s): VITAMINB12, FOLATE, FERRITIN, TIBC, IRON, RETICCTPCT in the last 72 hours. Sepsis  Labs: Recent Labs  Lab 01/27/20 1931 01/27/20 2250  PROCALCITON <0.10  --   LATICACIDVEN 1.9 1.5    Recent Results (from the past 240 hour(s))  Blood culture (routine x 2)     Status: None (Preliminary result)   Collection Time: 01/27/20  7:31 PM   Specimen: BLOOD  Result Value Ref Range Status   Specimen Description BLOOD LUE  Final   Special Requests   Final    BOTTLES DRAWN AEROBIC AND ANAEROBIC Blood Culture results may not be optimal due to an inadequate volume of blood received in culture bottles   Culture   Final    NO GROWTH < 12 HOURS Performed at [facility], [address], [place], [place] [address]    Report Status PENDING  Incomplete  Blood culture (routine x 2)     Status: None (Preliminary result)   Collection Time: 01/27/20  7:31 PM   Specimen: BLOOD  Result Value Ref Range Status   Specimen Description BLOOD BLOOD LEFT ARM  Final   Special Requests   Final    BOTTLES DRAWN AEROBIC AND ANAEROBIC Blood Culture results may not be optimal due to an inadequate volume of blood received in culture bottles   Culture   Final    NO GROWTH < 12 HOURS Performed at [facility], [address], [place], [place] [address]    Report Status PENDING  Incomplete         Radiology Studies: DG Chest Port 1 View  Result Date: 01/27/2020 CLINICAL DATA:  Shortness of breath COVID positive [redacted] weeks pregnant EXAM: PORTABLE CHEST 1 VIEW COMPARISON:  02/02/2012 FINDINGS: Right-sided shunt catheter. Mild patchy foci of airspace disease bilaterally. Normal heart size. No pneumothorax. IMPRESSION: Mild patchy foci of airspace disease bilaterally, suspect for multifocal pneumonia. Electronically Signed   By: Jasmine Pang M.D.   On: 01/27/2020 18:21   US Abdomen Limited RUQ (LIVER/GB)  Result Date: 01/27/2020 CLINICAL DATA:  Right upper quadrant pain and chest pain. COVID positive. Pregnant. EXAM: ULTRASOUND ABDOMEN LIMITED RIGHT UPPER QUADRANT COMPARISON:   None. FINDINGS: Gallbladder: Cholelithiasis with multiple small stones demonstrated in the gallbladder. No gallbladder wall thickening or edema. Murphy's sign is negative. Common bile duct: Diameter: 2.7 mm, normal Liver: No focal lesion identified. Within normal limits in parenchymal echogenicity. Portal vein is patent on color Doppler imaging with normal direction of blood flow towards the liver. Other: None. IMPRESSION: Cholelithiasis. No evidence of cholecystitis. Electronically Signed   By: Burman Nieves M.D.   On: 01/27/2020 19:42        Scheduled Meds: Continuous Infusions: . lactated ringers 125 mL/hr  at 01/28/20 0747     LOS: 1 day    Time spent: 32 mins    Wyvonnia Dusky, MD [facility] Pager [phone]  If 7PM-7AM, please contact night-coverage 01/28/2020, 1:57 PM

## 2020-01-28 NOTE — L&D Delivery Note (Signed)
OB/GYN Faculty Practice Delivery Note  Vicki Mcdowell is a 35 y.o. G1P0000 s/p vaginal delivery at [redacted]w[redacted]d. She was admitted for COVID pneumonia on 5L O2 with a VP shunt transferred from [facility]. Brought to L&D for IOL for 6/8 BPP in setting of Covid infection.  ROM: 6h 70m with clear fluid GBS Status: Negative Maximum Maternal Temperature: 98.8 F  Labor Progress: . Initial SVE 2/80/-1, cytotec x1, FB placed and low dose pitocin started. Pitocin titrated as indicated until complete cervical dilation at 1943.   Delivery Date/Time: 02/03/2020 at 2035 Delivery: Called to room and patient was complete and pushing. Head delivered LOA. No nuchal cord present. Shoulder and body delivered in usual fashion. Infant with spontaneous cry, placed on mother's abdomen, dried and stimulated. Cord clamped x 2 after 1-minute delay, and cut by maternal grandfather. Cord blood drawn. Placenta delivered spontaneously, intact, with 3-vessel cord. Fundus firm with massage and Pitocin. Labia, perineum, vagina, and cervix inspected, no laceration found.   Placenta: spontaneous, intact at 2039 Complications: none Lacerations: none EBL: 550 Analgesia: Epidural  Postpartum Planning [x]  transfer orders to MB [x]  discharge summary started & shared [x]  message to sent to schedule follow-up  [x]  lists updated [x]  vaccines UTD  Infant: Girl  APGARs 9/9  weight per medical record  , [facility] 02/03/2020, 9:04 PM

## 2020-01-28 NOTE — Consult Note (Addendum)
History and Physical    Vicki Mcdowell QQV:[id number] DOB: [birth year] DOA: 01/27/2020  Referring MD/NP/PA:   PCP: Ailene Ravel, MD   Patient coming from:  The patient is coming from home.  At baseline, pt is independent for most of ADL.        Chief Complaint: Shortness of breath  HPI: Vicki Mcdowell is a 35 y.o. female with medical history significant of hydrocephalus s/p VP shunt, , obesity presented with shortness of breath, cough  Per patient, chart review, patient currently pregnancy 36-week, last 4 to 5 days, she started have mild cough, shortness of breath, change of smell/taste, fatigue, low p.o. intake, nausea but no vomiting, no diarrhea, was diagnosed Covid 2 days ago.  Patient reports no dizziness, fever, chills, chest pain, palpitation, dysuria, leg swelling.  pt was found to have T-max 98.8, heart rate 93-1 1 7, now 103, respiratory rate 14-42, now 24, blood pressure 130/73, oxygen saturation 97% on room air.  Sodium 131, bicarb 15, glucose 112, ALP 160, AST 274, ALT 162.  High-sensitivity troponin 4-5, lactic acid 1.9-1.5, procalcitonin less than 0.1 unremarkable CBC Chest x-ray:Mild patchy foci of airspace disease bilaterally, suspect for multifocal pneumonia. Abdominal ultrasound: Cholelithiasis. No evidence of cholecystitis. EKG: Sinus tachycardia, QTC 4 7 5  ms  Patient was admitted by OB/GYN Hospitalist was requested to consult for COVID-19   Review of Systems:   General: no fevers, chills,  has poor appetite, has fatigue HEENT: no blurry vision, hearing changes Respiratory: see HPI CV: no chest pain, no palpitations GI: no vomiting, abdominal pain, diarrhea, constipation GU: no dysuria,  Ext: no leg edema Neuro: no unilateral weakness, numbness, or tingling, no vision change or hearing loss Skin: no rash, no skin tear. MSK: No muscle spasm, no deformity, no limitation of range of movement in spin   Allergy: No Known Allergies  Past Medical History:   Diagnosis Date  . VP (ventriculoperitoneal) shunt status     Past Surgical History:  Procedure Laterality Date  . DG HYSTEROGRAM (HSG)  10/28/2018      . hydroceph    . SHUNT REVISION VENTRICULAR-PERITONEAL      Social History:  reports that she has never smoked. She has never used smokeless tobacco. She reports that she does not drink alcohol and does not use drugs.  Family History: History reviewed. No pertinent family history.   Prior to Admission medications   Medication Sig Start Date End Date Taking? Authorizing Provider  ondansetron (ZOFRAN ODT) 4 MG disintegrating tablet Take 1 tablet (4 mg total) by mouth every 6 (six) hours as needed for nausea. 09/12/19  Yes 09/14/19, MD    Physical Exam: Vitals:   01/27/20 2130 01/27/20 2200 01/27/20 2230 01/27/20 2327  BP: 115/73 (!) 102/56 117/73 130/73  Pulse: (!) 101 (!) 106 98 (!) 103  Resp: 20 (!) 37 (!) 40 (!) 24  Temp:    98.1 F (36.7 C)  TempSrc:      SpO2: 97% 97% 98% 97%  Weight:    86.6 kg  Height:    5\' 4"  (1.626 m)   General: Not in acute distress HEENT:       Eyes: PERRL, EOMI, no scleral icterus.       ENT: No discharge from the ears and nose, no pharynx injection, no tonsillar enlargement.        Neck: No JVD, no bruit, no mass felt. Heme: No neck lymph node enlargement. Cardiac: S1/S2, RRR, No murmurs,  No gallops or rubs. Respiratory: Good air movement bilaterally. No rales, wheezing, rhonchi or rubs. GI: Soft, nondistended, nontender, no rebound pain, no organomegaly, BS present. GU: No hematuria Ext: No pitting leg edema bilaterally. 2+DP/PT pulse bilaterally. Musculoskeletal: No joint deformities, No joint redness or warmth, no limitation of ROM in spin. Skin: No rashes.  Neuro: Alert, oriented X3, cranial nerves II-XII grossly intact, moves all extremities normally. Muscle strength 5/5 in all extremities, sensation to light touch intact. Brachial reflex 2+ bilaterally. Knee reflex 1+  bilaterally. Negative Babinski's sign. Normal finger to nose test. Psych: Patient is not psychotic, no suicidal or hemocidal ideation.  Labs on Admission: I have personally reviewed following labs and imaging studies  CBC: Recent Labs  Lab 01/27/20 1638  WBC 6.5  HGB 14.6  HCT 42.8  MCV 86.8  PLT 563   Basic Metabolic Panel: Recent Labs  Lab 01/27/20 1638  NA 131*  K 3.8  CL 103  CO2 15*  GLUCOSE 112*  BUN 9  CREATININE 0.79  CALCIUM 8.4*   GFR: Estimated Creatinine Clearance: 105.6 mL/min (by C-G formula based on SCr of 0.79 mg/dL). Liver Function Tests: Recent Labs  Lab 01/27/20 1638  AST 274*  ALT 162*  ALKPHOS 160*  BILITOT 1.9*  PROT 6.7  ALBUMIN 2.7*   No results for input(s): LIPASE, AMYLASE in the last 168 hours. No results for input(s): AMMONIA in the last 168 hours. Coagulation Profile: No results for input(s): INR, PROTIME in the last 168 hours. Cardiac Enzymes: No results for input(s): CKTOTAL, CKMB, CKMBINDEX, TROPONINI in the last 168 hours. BNP (last 3 results) No results for input(s): PROBNP in the last 8760 hours. HbA1C: No results for input(s): HGBA1C in the last 72 hours. CBG: No results for input(s): GLUCAP in the last 168 hours. Lipid Profile: No results for input(s): CHOL, HDL, LDLCALC, TRIG, CHOLHDL, LDLDIRECT in the last 72 hours. Thyroid Function Tests: No results for input(s): TSH, T4TOTAL, FREET4, T3FREE, THYROIDAB in the last 72 hours. Anemia Panel: No results for input(s): VITAMINB12, FOLATE, FERRITIN, TIBC, IRON, RETICCTPCT in the last 72 hours. Urine analysis:    Component Value Date/Time   COLORURINE RED (A) 02/06/2012 1313   APPEARANCEUR CLOUDY (A) 02/06/2012 1313   LABSPEC 1.009 02/06/2012 1313   PHURINE 7.5 02/06/2012 1313   GLUCOSEU Negative 01/12/2020 1428   HGBUR LARGE (A) 02/06/2012 1313   BILIRUBINUR NEGATIVE 02/06/2012 1313   KETONESUR 15 (A) 02/06/2012 1313   PROTEINUR 100 (A) 02/06/2012 1313    UROBILINOGEN 0.2 02/06/2012 1313   NITRITE NEGATIVE 02/06/2012 1313   LEUKOCYTESUR MODERATE (A) 02/06/2012 1313   Sepsis Labs: @LABRCNTIP (procalcitonin:4,lacticidven:4) )No results found for this or any previous visit (from the past 240 hour(s)).   Radiological Exams on Admission: DG Chest Port 1 View  Result Date: 01/27/2020 CLINICAL DATA:  Shortness of breath COVID positive [redacted] weeks pregnant EXAM: PORTABLE CHEST 1 VIEW COMPARISON:  02/02/2012 FINDINGS: Right-sided shunt catheter. Mild patchy foci of airspace disease bilaterally. Normal heart size. No pneumothorax. IMPRESSION: Mild patchy foci of airspace disease bilaterally, suspect for multifocal pneumonia. Electronically Signed   By: Donavan Foil M.D.   On: 01/27/2020 18:21   US Abdomen Limited RUQ (LIVER/GB)  Result Date: 01/27/2020 CLINICAL DATA:  Right upper quadrant pain and chest pain. COVID positive. Pregnant. EXAM: ULTRASOUND ABDOMEN LIMITED RIGHT UPPER QUADRANT COMPARISON:  None. FINDINGS: Gallbladder: Cholelithiasis with multiple small stones demonstrated in the gallbladder. No gallbladder wall thickening or edema. Murphy's sign is negative. Common bile  duct: Diameter: 2.7 mm, normal Liver: No focal lesion identified. Within normal limits in parenchymal echogenicity. Portal vein is patent on color Doppler imaging with normal direction of blood flow towards the liver. Other: None. IMPRESSION: Cholelithiasis. No evidence of cholecystitis. Electronically Signed   By: Burman Nieves M.D.   On: 01/27/2020 19:42     EKG: Independently reviewed.  Not done in ED, will get one.   Assessment/Plan Principal Problem:   COVID-19 affecting pregnancy in third trimester Active Problems:   Supervision of other normal pregnancy, antepartum   Obesity in pregnancy, antepartum, third trimester   VP (ventriculoperitoneal) shunt status   Transaminitis   Pneumonia due to COVID-19 virus   Shortness of breath after COVID-19  vaccination   Principal Problem:   COVID-19 affecting pregnancy in third trimester Active Problems:   Supervision of other normal pregnancy, antepartum   Obesity in pregnancy, antepartum, third trimester   VP (ventriculoperitoneal) shunt status   Transaminitis   Pneumonia due to COVID-19 virus   Shortness of breath after COVID-19 vaccination  #COVID-19:  Patient have mild cough, sob, transaminitis, change of smell/taste, fatigue COVID19 test positive 2 days gao, chest x-ray shows b/l infiltrate.   Now NOT requiring oxygen. Admit telemetry, RT manage oxygen, breath treatment Patient is moderate risk,  Troponin WNL x2 Check CRP for risks stratification No indication for steroids, remdesivir giving no oxygen requirement Symptomatic and supportive management. Close monitoring  #Pregnancy: Management by primary team   Thank you for  let us participate in patient's care.  Hospitalist team will follow up   DVT ppx: per primary team Code Status: Full code Family Communication: None at bed side.               Disposition Plan:  Per primary team Consults called:   Admission status: per primary team   Date of Service 01/28/2020    Rayne Du [facility]   If 7PM-7AM, please contact night-coverage [email or web address] Password [facility] 01/28/2020, 12:28 AM

## 2020-01-28 NOTE — Consult Note (Signed)
Remdesivir - Pharmacy Brief Note   O:  ALT: 160 CXR: 01/27/20 Mild patchy foci of airspace disease bilaterally, suspect for multifocal pneumonia. CXR for 1/1 pending SpO2: 97% on 1L [place]   A/P:  Remdesivir 200 mg IVPB once followed by 100 mg IVPB daily x 4 days.   ALT slightly elevated (~3x ULN), continue to monitor while patient remains on remdesivir and consider discontinuation if ALT worsens.   Raiford Noble, PharmD Pharmacy Resident  01/28/2020 3:03 PM

## 2020-01-28 NOTE — Progress Notes (Signed)
S: Patient reports she is feeling "a little better". She is sleeping a lot per her mom. We discussed implications of patient VP shunt status as it relates to delivery. Per anesthesia discussion, possible concerns could be related to epidural or spinal anesthesia. Dr Pernell Dupre recommends consult with Neurology regarding possible complications.   O: Vital Signs: BP 122/70   Pulse 96   Temp 99.1 F (37.3 C) (Oral)   Resp (!) 32   Ht 5\' 4"  (1.626 m)   Wt 86.6 kg   LMP 05/18/2019   SpO2 94%   BMI 32.79 kg/m  Constitutional: Well nourished, well developed female in no acute distress.  HEENT: normal Skin: Warm and dry.  Cardiovascular: Regular rate and rhythm.   Extremity: no edema  Respiratory: Rales present, Normal respiratory effort Abdomen: Gravid, FHT present Neuro: DTRs 2+, Cranial nerves grossly intact Psych: Alert and Oriented x3. No memory deficits. Normal mood and affect.   Toco: irregular contractions every 6-10 minutes lasting 30-40 seconds Fetal well being: 125 bpm, moderate variability with periods of minimal variability especially after patient gets up to bedside commode, +accelerations, occasional variable seen Category II and overall reassuring  Patient Vitals for the past 24 hrs:  BP Temp Temp src Pulse Resp SpO2 Height Weight  01/28/20 1909 - - - - - 94 % - -  01/28/20 1904 - - - - - 95 % - -  01/28/20 1859 - - - - - 95 % - -  01/28/20 1854 - - - - - 95 % - -  01/28/20 1849 - - - - - 96 % - -  01/28/20 1844 - - - - - 95 % - -  01/28/20 1840 - - - - - 95 % - -  01/28/20 1839 - - - - - 95 % - -  01/28/20 1834 - - - - - 95 % - -  01/28/20 1829 - - - - - 93 % - -  01/28/20 1824 - - - - - 94 % - -  01/28/20 1821 122/70 - - 96 (!) 32 - - -  01/28/20 1820 - 99.1 F (37.3 C) Oral - - - - -  01/28/20 1819 - - - - - 94 % - -  01/28/20 1814 - - - - - 95 % - -  01/28/20 1809 - - - - - 96 % - -  01/28/20 1804 - - - - - 93 % - -  01/28/20 1800 - - - - - 95 % - -  01/28/20  1509 (!) 102/57 - - 91 - 97 % - -  01/28/20 1507 - 100 F (37.8 C) Oral - - - - -  01/28/20 1119 - 99.1 F (37.3 C) Oral - (!) 24 - - -  01/28/20 1039 - - - - - 94 % - -  01/28/20 1034 - - - - - 95 % - -  01/28/20 1029 - - - - - 94 % - -  01/28/20 1024 - - - - - 94 % - -  01/28/20 1019 - - - - - 95 % - -  01/28/20 1014 - - - - - 95 % - -  01/28/20 1010 - - - - - 95 % - -  01/28/20 1009 - - - - - 95 % - -  01/28/20 1004 - - - - - 95 % - -  01/28/20 0959 - - - - - 95 % - -  01/28/20 0954 - - - - - 95 % - -  01/28/20 0949 - - - - - 94 % - -  01/28/20 0944 - - - - - 96 % - -  01/28/20 0939 - - - - - 95 % - -  01/28/20 0934 - - - - - 95 % - -  01/28/20 0929 - - - - - 95 % - -  01/28/20 0924 - - - - - 95 % - -  01/28/20 0919 - - - - - 97 % - -  01/28/20 0914 - - - - - 96 % - -  01/28/20 0909 - - - - - 97 % - -  01/28/20 0904 - - - - - 96 % - -  01/28/20 0859 - - - - - 97 % - -  01/28/20 0854 - - - - - 97 % - -  01/28/20 0849 - - - - - 97 % - -  01/28/20 0844 - - - - - 97 % - -  01/28/20 0839 - - - - - 97 % - -  01/28/20 0834 - - - - - 97 % - -  01/28/20 0829 - - - - - 97 % - -  01/28/20 0824 - - - - - 97 % - -  01/28/20 0819 - - - - - 97 % - -  01/28/20 0814 - - - - - 97 % - -  01/28/20 0809 - - - - - 97 % - -  01/28/20 0804 - - - - - 97 % - -  01/28/20 0759 - - - - - 96 % - -  01/28/20 0754 - - - - - 96 % - -  01/28/20 0749 117/68 98.5 F (36.9 C) Oral 100 - 97 % - -  01/28/20 0744 - - - - - 98 % - -  01/28/20 0739 - - - - - 97 % - -  01/28/20 0734 - - - - - 97 % - -  01/28/20 0729 - - - - - 97 % - -  01/28/20 0724 - - - - - 98 % - -  01/28/20 0719 - - - - - 98 % - -  01/28/20 0714 - - - - - 98 % - -  01/28/20 0709 - - - - - 98 % - -  01/28/20 0704 - - - - - 98 % - -  01/28/20 0700 - - - - - 97 % - -  01/28/20 0645 - - - - - 97 % - -  01/28/20 0630 - - - - - 94 % - -  01/28/20 0621 - 98.2 F (36.8 C) Oral - - - - -  01/28/20 0620 125/67 - - (!) 104 (!) 30 - - -   01/28/20 0600 - - - - - 97 % - -  01/28/20 0545 - - - - - 97 % - -  01/28/20 0530 - - - - - 96 % - -  01/28/20 0515 - - - - - 96 % - -  01/28/20 0500 - - - - - 96 % - -  01/28/20 0445 - - - - - 95 % - -  01/28/20 0430 - - - - - 95 % - -  01/28/20 0415 - - - - - 96 % - -  01/28/20 0400 - - - - - 95 % - -  01/28/20 0345 - - - - -  95 % - -  01/28/20 0330 - - - - - 94 % - -  01/28/20 0315 - - - - - 96 % - -  01/28/20 0300 - - - - - 95 % - -  01/28/20 0245 - - - - - 96 % - -  01/28/20 0230 - - - - - 95 % - -  01/28/20 0215 - - - - - 95 % - -  01/28/20 0200 - - - - - 96 % - -  01/28/20 0145 - - - - - 96 % - -  01/28/20 0140 119/68 98.6 F (37 C) Oral (!) 103 (!) 22 96 % - -  01/28/20 0130 - - - - - 95 % - -  01/28/20 0115 - - - - - 96 % - -  01/28/20 0100 - - - - - 97 % - -  01/28/20 0045 - - - - - 96 % - -  01/28/20 0030 - - - - - 96 % - -  01/28/20 0015 - - - - - 96 % - -  01/28/20 0000 - - - - - 96 % - -  01/27/20 2345 - - - - - 97 % - -  01/27/20 2330 - - - - - 97 % - -  01/27/20 2327 130/73 98.1 F (36.7 C) - (!) 103 (!) 24 97 % 5\' 4"  (1.626 m) 86.6 kg  01/27/20 2245 - - - - - 96 % - -  01/27/20 2230 117/73 - - 98 (!) 40 98 % - -  01/27/20 2215 - - - - - 95 % - -  01/27/20 2200 (!) 102/56 - - (!) 106 (!) 37 97 % - -  01/27/20 2130 115/73 - - (!) 101 20 97 % - -  01/27/20 2100 109/63 - - 93 14 96 % - -  01/27/20 2030 116/72 - - 94 16 97 % - -  01/27/20 2010 118/67 - - (!) 105 20 96 % - -  01/27/20 1930 128/75 - - (!) 101 (!) 22 97 % - -   Attempts have been made to wean off O2. Patient continues to have saturation levels at 90-92 when up to bedside commode with 2 liters of O2.  Results for Vicki Mcdowell, Vicki Mcdowell (MRN Vicki Mcdowell) as of 01/28/2020 19:31  Ref. Range 01/28/2020 06:06 01/28/2020 15:23 01/28/2020 18:22  COMPREHENSIVE METABOLIC PANEL Unknown Rpt (A)    Sodium Latest Ref Range: 135 - 145 mmol/L 132 (L)    Potassium Latest Ref Range: 3.5 - 5.1 mmol/L 3.6    Chloride Latest  Ref Range: 98 - 111 mmol/L 103    CO2 Latest Ref Range: 22 - 32 mmol/L 18 (L)    Glucose Latest Ref Range: 70 - 99 mg/dL 93    BUN Latest Ref Range: 6 - 20 mg/dL 8    Creatinine Latest Ref Range: 0.44 - 1.00 mg/dL 03/27/2020 8.52   Calcium Latest Ref Range: 8.9 - 10.3 mg/dL 7.9 (L)    Anion gap Latest Ref Range: 5 - 15  11    Alkaline Phosphatase Latest Ref Range: 38 - 126 U/L 134 (H)    Albumin Latest Ref Range: 3.5 - 5.0 g/dL 2.1 (L)    AST Latest Ref Range: 15 - 41 U/L 256 (H)    ALT Latest Ref Range: 0 - 44 U/L 160 (H)    Total Protein Latest Ref Range: 6.5 - 8.1 g/dL 5.3 (L)  Total Bilirubin Latest Ref Range: 0.3 - 1.2 mg/dL 2.0 (H)    GFR, Estimated Latest Ref Range: >60 mL/min >60 >60   WBC Latest Ref Range: 4.0 - 10.5 K/uL 6.8 6.6   RBC Latest Ref Range: 3.87 - 5.11 MIL/uL 4.08 4.00   Hemoglobin Latest Ref Range: 12.0 - 15.0 g/dL 20.9 47.0 (L)   HCT Latest Ref Range: 36.0 - 46.0 % 35.2 (L) 34.4 (L)   MCV Latest Ref Range: 80.0 - 100.0 fL 86.3 86.0   MCH Latest Ref Range: 26.0 - 34.0 pg 29.9 29.5   MCHC Latest Ref Range: 30.0 - 36.0 g/dL 96.2 83.6   RDW Latest Ref Range: 11.5 - 15.5 % 13.0 12.8   Platelets Latest Ref Range: 150 - 400 K/uL 195 188   nRBC Latest Ref Range: 0.0 - 0.2 % 0.0 0.3 (H)   HIV Screen 4th Generation wRfx Latest Ref Range: Non Reactive  Non Reactive    CULTURE, BETA STREP (GROUP B ONLY) Unknown   Rpt   GBS specimen collected by RN  A: 35 y.o. G1 P0 female at [redacted]w[redacted]d with Covid 19 pneumonia  P: Daily Labs per orders IV fluids: normal saline Regular diet Covid treatment: Remdesivir, Decadron, Vitamin C, Zinc Robitussin, zofran as needed DVT ppx: Lovenox 40 mg daily Neurology consult regarding VP shunt   Vicki Mcdowell, CNM [facility] 01/28/2020, 7:38 PM

## 2020-01-28 NOTE — Progress Notes (Signed)
Patient requiring oxygen to maintain O2 saturation. Having desaturation events when ambulating to the bathroom.  Will start Remdesivir and IV steroids Given supplemental oxygen requirements.  Added Robitussin for persistent cough.  Started Lovenox for DVT prevention.  Plan for chest x-ray tomorrow morning. Reviewed with medicine team requirements to maintain oxygen saturation at 94% or greater during pregnancy for covid + patients.   Adelene Idler MD, [facility], [facility] 01/28/2020 3:10 PM

## 2020-01-29 ENCOUNTER — Inpatient Hospital Stay: Payer: Medicaid Other

## 2020-01-29 DIAGNOSIS — R7401 Elevation of levels of liver transaminase levels: Secondary | ICD-10-CM | POA: Diagnosis not present

## 2020-01-29 DIAGNOSIS — O98513 Other viral diseases complicating pregnancy, third trimester: Secondary | ICD-10-CM | POA: Diagnosis not present

## 2020-01-29 DIAGNOSIS — Z3480 Encounter for supervision of other normal pregnancy, unspecified trimester: Secondary | ICD-10-CM | POA: Diagnosis not present

## 2020-01-29 DIAGNOSIS — U071 COVID-19: Secondary | ICD-10-CM | POA: Diagnosis not present

## 2020-01-29 DIAGNOSIS — J1282 Pneumonia due to coronavirus disease 2019: Secondary | ICD-10-CM

## 2020-01-29 DIAGNOSIS — K8080 Other cholelithiasis without obstruction: Secondary | ICD-10-CM | POA: Diagnosis not present

## 2020-01-29 DIAGNOSIS — O26613 Liver and biliary tract disorders in pregnancy, third trimester: Secondary | ICD-10-CM | POA: Diagnosis not present

## 2020-01-29 LAB — MAGNESIUM: Magnesium: 1.8 mg/dL (ref 1.7–2.4)

## 2020-01-29 LAB — HEPATIC FUNCTION PANEL
ALT: 376 U/L — ABNORMAL HIGH (ref 0–44)
AST: 611 U/L — ABNORMAL HIGH (ref 15–41)
Albumin: 1.9 g/dL — ABNORMAL LOW (ref 3.5–5.0)
Alkaline Phosphatase: 144 U/L — ABNORMAL HIGH (ref 38–126)
Bilirubin, Direct: 1.3 mg/dL — ABNORMAL HIGH (ref 0.0–0.2)
Indirect Bilirubin: 0.8 mg/dL (ref 0.3–0.9)
Total Bilirubin: 2.1 mg/dL — ABNORMAL HIGH (ref 0.3–1.2)
Total Protein: 5.3 g/dL — ABNORMAL LOW (ref 6.5–8.1)

## 2020-01-29 LAB — BASIC METABOLIC PANEL
Anion gap: 12 (ref 5–15)
BUN: 8 mg/dL (ref 6–20)
CO2: 16 mmol/L — ABNORMAL LOW (ref 22–32)
Calcium: 8.1 mg/dL — ABNORMAL LOW (ref 8.9–10.3)
Chloride: 110 mmol/L (ref 98–111)
Creatinine, Ser: 0.81 mg/dL (ref 0.44–1.00)
GFR, Estimated: >60 mL/min (ref >60)
Glucose, Bld: 96 mg/dL (ref 70–99)
Potassium: 3.6 mmol/L (ref 3.5–5.1)
Sodium: 138 mmol/L (ref 135–145)

## 2020-01-29 LAB — CBC
HCT: 36.8 % (ref 36.0–46.0)
Hemoglobin: 12.5 g/dL (ref 12.0–15.0)
MCH: 29.6 pg (ref 26.0–34.0)
MCHC: 34 g/dL (ref 30.0–36.0)
MCV: 87 fL (ref 80.0–100.0)
Platelets: 227 10*3/uL (ref 150–400)
RBC: 4.23 MIL/uL (ref 3.87–5.11)
RDW: 12.8 % (ref 11.5–15.5)
WBC: 8.6 10*3/uL (ref 4.0–10.5)
nRBC: 0.2 % (ref 0.0–0.2)

## 2020-01-29 LAB — GLUCOSE, CAPILLARY
Glucose-Capillary: 90 mg/dL (ref 70–99)
Glucose-Capillary: 93 mg/dL (ref 70–99)
Glucose-Capillary: 101 mg/dL — ABNORMAL HIGH (ref 70–99)

## 2020-01-29 LAB — FIBRIN DERIVATIVES D-DIMER (ARMC ONLY): Fibrin derivatives D-dimer (ARMC): 2255.97 ng/mL (FEU) — ABNORMAL HIGH (ref 0.00–499.00)

## 2020-01-29 LAB — C-REACTIVE PROTEIN: CRP: 10.4 mg/dL — ABNORMAL HIGH (ref <1.0)

## 2020-01-29 MED ORDER — INSULIN ASPART 100 UNIT/ML ~~LOC~~ SOLN: 0.0000 [IU] | Freq: Three times a day (TID) | SUBCUTANEOUS | Status: DC

## 2020-01-29 NOTE — Progress Notes (Signed)
Daily Antepartum Note  Admission Date: 01/27/2020 Current Date: 01/29/2020 1:23 PM  Chief Complaint:  Vicki Mcdowell is a 35 y.o. G1P0000 @ [redacted]w[redacted]d by LMP consistent with 7 week u/s, HD#3, admitted for COVID19 pneumonia affecting pregnancy. She had moderate severity disease. .  Pregnancy complicated by: Patient Active Problem List   Diagnosis Date Noted  . COVID-19 affecting pregnancy in third trimester 01/27/2020  . Transaminitis 01/27/2020  . Pneumonia due to COVID-19 virus 01/27/2020  . Shortness of breath after COVID-19 vaccination 01/27/2020  . VP (ventriculoperitoneal) shunt status   . Obesity in pregnancy, antepartum, third trimester 01/12/2020  . Obesity, Class I, BMI 30-34.9 01/12/2020  . Supervision of other normal pregnancy, antepartum 07/08/2019    Overnight/24hr events:  No acute events  Subjective:  Today her cough is improved with Tussionex.  She notes +FM, no LOF, no vaginal bleeding, and no contractions. She believes her breathing and shortness of breath have improved. She still is requiring supplemental O2 at rest and this need increases with ambulation.  She is currently on 2L [place].  Objective:   Vitals:   01/29/20 1304 01/29/20 1309  BP:    Pulse:    Resp:    Temp:    SpO2: 95% 96%   Temp:  [97.7 F (36.5 C)-100 F (37.8 C)] 98.1 F (36.7 C) (01/02 1026) Pulse Rate:  [79-96] 79 (01/02 1026) Resp:  [21-32] 28 (01/02 1026) BP: (102-122)/(57-70) 119/64 (01/02 1026) SpO2:  [87 %-98 %] 96 % (01/02 1309) Temp (24hrs), Avg:98.5 F (36.9 C), Min:97.7 F (36.5 C), Max:100 F (37.8 C)   Intake/Output Summary (Last 24 hours) at 01/29/2020 1323 Last data filed at 01/29/2020 1008 Gross per 24 hour  Intake --  Output 250 ml  Net -250 ml     Current Vital Signs 24h Vital Sign Ranges  T 98.1 F (36.7 C) Temp  Avg: 98.5 F (36.9 C)  Min: 97.7 F (36.5 C)  Max: 100 F (37.8 C)  BP 119/64 BP  Min: 102/57  Max: 122/70  HR 79 Pulse  Avg: 87.8  Min: 79  Max: 96   RR (!) 28 (short of breath from getting up to bsc) Resp  Avg: 27  Min: 21  Max: 32  SaO2 96 % Nasal Cannula SpO2  Avg: 94.4 %  Min: 87 %  Max: 98 %       24 Hour I/O Current Shift I/O  Time Ins Outs No intake/output data recorded. 01/02 0701 - 01/02 1900 In: -  Out: 250 [Urine:250]   Patient Vitals for the past 24 hrs:  BP Temp Temp src Pulse Resp SpO2  01/29/20 1309 -- -- -- -- -- 96 %  01/29/20 1304 -- -- -- -- -- 95 %  01/29/20 1259 -- -- -- -- -- 97 %  01/29/20 1254 -- -- -- -- -- 95 %  01/29/20 1249 -- -- -- -- -- 94 %  01/29/20 1244 -- -- -- -- -- 98 %  01/29/20 1239 -- -- -- -- -- 97 %  01/29/20 1234 -- -- -- -- -- 95 %  01/29/20 1229 -- -- -- -- -- 95 %  01/29/20 1224 -- -- -- -- -- 94 %  01/29/20 1219 -- -- -- -- -- 94 %  01/29/20 1214 -- -- -- -- -- 94 %  01/29/20 1209 -- -- -- -- -- 95 %  01/29/20 1204 -- -- -- -- -- 95 %  01/29/20 1159 -- -- -- -- --  95 %  01/29/20 1154 -- -- -- -- -- 95 %  01/29/20 1149 -- -- -- -- -- 96 %  01/29/20 1144 -- -- -- -- -- 95 %  01/29/20 1139 -- -- -- -- -- 95 %  01/29/20 1134 -- -- -- -- -- 95 %  01/29/20 1129 -- -- -- -- -- 94 %  01/29/20 1124 -- -- -- -- -- 95 %  01/29/20 1119 -- -- -- -- -- 96 %  01/29/20 1114 -- -- -- -- -- 95 %  01/29/20 1109 -- -- -- -- -- 96 %  01/29/20 1104 -- -- -- -- -- 95 %  01/29/20 1059 -- -- -- -- -- 94 %  01/29/20 1054 -- -- -- -- -- 93 %  01/29/20 1049 -- -- -- -- -- 94 %  01/29/20 1044 -- -- -- -- -- 94 %  01/29/20 1039 -- -- -- -- -- 92 %  01/29/20 1034 -- -- -- -- -- 93 %  01/29/20 1029 -- -- -- -- -- 93 %  01/29/20 1026 119/64 98.1 F (36.7 C) Oral 79 (!) 28 94 %  01/29/20 1019 -- -- -- -- -- 91 %  01/29/20 1014 -- -- -- -- -- (!) 87 %  01/29/20 1009 -- -- -- -- -- 90 %  01/29/20 1004 -- -- -- -- -- 93 %  01/29/20 0959 -- -- -- -- -- 94 %  01/29/20 0954 -- -- -- -- -- 96 %  01/29/20 0949 -- -- -- -- -- 95 %  01/29/20 0944 -- -- -- -- -- 98 %  01/29/20 0939 -- -- -- -- -- 96 %   01/29/20 0934 -- -- -- -- -- 95 %  01/29/20 0929 -- -- -- -- -- 95 %  01/29/20 0924 -- -- -- -- -- 96 %  01/29/20 0919 -- -- -- -- -- 94 %  01/29/20 0914 -- -- -- -- -- 95 %  01/29/20 0909 -- -- -- -- -- 95 %  01/29/20 0904 -- -- -- -- -- 95 %  01/29/20 0859 -- -- -- -- -- 95 %  01/29/20 0854 -- -- -- -- -- 94 %  01/29/20 0849 -- -- -- -- -- 95 %  01/29/20 0844 -- -- -- -- -- 94 %  01/29/20 0839 -- -- -- -- -- 94 %  01/29/20 0834 -- -- -- -- -- 94 %  01/29/20 0829 -- -- -- -- -- 93 %  01/29/20 0824 -- -- -- -- -- 92 %  01/29/20 0819 -- -- -- -- -- 93 %  01/29/20 0814 -- -- -- -- -- 95 %  01/29/20 0809 -- -- -- -- -- 95 %  01/29/20 0804 -- -- -- -- -- 95 %  01/29/20 0759 -- -- -- -- -- 95 %  01/29/20 0754 -- -- -- -- -- 96 %  01/29/20 0749 -- -- -- -- -- 94 %  01/29/20 0744 -- -- -- -- -- 94 %  01/29/20 0742 -- 98.3 F (36.8 C) Axillary -- -- 94 %  01/29/20 0741 (!) 117/58 97.8 F (36.6 C) Oral 82 -- --  01/29/20 0739 -- -- -- -- -- 94 %  01/29/20 0645 108/61 97.7 F (36.5 C) Axillary 91 (!) 21 90 %  01/29/20 0639 -- -- -- -- -- 91 %  01/29/20 0634 -- -- -- -- -- 91 %  01/29/20 0202 -- 98.2 F (36.8 C) Oral -- -- --  01/28/20 1909 -- -- -- -- -- 94 %  01/28/20 1904 -- -- -- -- -- 95 %  01/28/20 1859 -- -- -- -- -- 95 %  01/28/20 1854 -- -- -- -- -- 95 %  01/28/20 1849 -- -- -- -- -- 96 %  01/28/20 1844 -- -- -- -- -- 95 %  01/28/20 1840 -- -- -- -- -- 95 %  01/28/20 1839 -- -- -- -- -- 95 %  01/28/20 1834 -- -- -- -- -- 95 %  01/28/20 1829 -- -- -- -- -- 93 %  01/28/20 1824 -- -- -- -- -- 94 %  01/28/20 1821 122/70 -- -- 96 (!) 32 --  01/28/20 1820 -- 99.1 F (37.3 C) Oral -- -- --  01/28/20 1819 -- -- -- -- -- 94 %  01/28/20 1814 -- -- -- -- -- 95 %  01/28/20 1809 -- -- -- -- -- 96 %  01/28/20 1804 -- -- -- -- -- 93 %  01/28/20 1800 -- -- -- -- -- 95 %  01/28/20 1509 (!) 102/57 -- -- 91 -- 97 %  01/28/20 1507 -- 100 F (37.8 C) Oral -- -- --    Physical  exam: General: Well nourished, well developed female in no acute distress. Abdomen: gravid, NT Cardiovascular: normal effort of breathing Respiratory: normal work of breathing, [place] in place Extremities: no clubbing, cyanosis or edema, SCDs in place Skin: Warm and dry.   Medications: Current Facility-Administered Medications  Medication Dose Route Frequency Provider Last Rate Last Admin  . 0.9 %  sodium chloride infusion   Intravenous Continuous [name], CNM 125 mL/hr at 01/29/20 0955 New Bag at 01/29/20 0955  . acetaminophen (TYLENOL) tablet 650 mg  650 mg Oral Q6H PRN [name], CNM      . ascorbic acid (VITAMIN C) tablet 250 mg  250 mg Oral Daily Schuman, Christanna R, MD   250 mg at 01/29/20 1004  . chlorpheniramine-HYDROcodone (TUSSIONEX) 10-8 MG/5ML suspension 5 mL  5 mL Oral Q12H PRN Conard Novak, MD   5 mL at 01/28/20 0136  . dexamethasone (DECADRON) injection 6 mg  6 mg Intravenous Q24H Schuman, Christanna R, MD   6 mg at 01/28/20 1555  . enoxaparin (LOVENOX) injection 40 mg  40 mg Subcutaneous Q24H Schuman, Christanna R, MD   40 mg at 01/28/20 1809  . guaiFENesin-dextromethorphan (ROBITUSSIN DM) 100-10 MG/5ML syrup 10 mL  10 mL Oral Q4H PRN Schuman, Christanna R, MD   10 mL at 01/29/20 1035  . ondansetron (ZOFRAN) injection 4 mg  4 mg Intravenous Q6H PRN Conard Novak, MD   4 mg at 01/28/20 1810  . ondansetron (ZOFRAN-ODT) disintegrating tablet 4 mg  4 mg Oral Q6H PRN Conard Novak, MD      . remdesivir 100 mg in sodium chloride 0.9 % 100 mL IVPB  100 mg Intravenous Daily Schuman, Christanna R, MD 200 mL/hr at 01/29/20 1037 100 mg at 01/29/20 1037  . zinc sulfate capsule 220 mg  220 mg Oral Daily Schuman, Christanna R, MD   220 mg at 01/29/20 1004    Labs:  Recent Labs  Lab 01/28/20 0606 01/28/20 1523 01/29/20 0647  WBC 6.8 6.6 8.6  HGB 12.2 11.8* 12.5  HCT 35.2* 34.4* 36.8  PLT 195 188 227    Recent Labs  Lab 01/27/20 1638 01/28/20 0606  01/28/20 1523 01/29/20 0647  NA 131* 132*  --  138  K 3.8 3.6  --  3.6  CL 103 103  --  110  CO2 15* 18*  --  16*  BUN 9 8  --  8  CREATININE 0.79 0.73 0.71 0.81  CALCIUM 8.4* 7.9*  --  8.1*  PROT 6.7 5.3*  --  5.3*  BILITOT 1.9* 2.0*  --  2.1*  ALKPHOS 160* 134*  --  144*  ALT 162* 160*  --  376*  AST 274* 256*  --  611*  GLUCOSE 112* 93  --  96     Radiology:  DG Chest 2 View  Result Date: 01/29/2020 CLINICAL DATA:  35 year old pregnant female positive COVID-19. Increasing shortness of breath. EXAM: CHEST - 2 VIEW COMPARISON:  Portable chest 01/27/2020 and earlier. FINDINGS: Seated AP and lateral views of the chest. Chronic right neck, chest and abdomen shunt catheter. Lung volumes and mediastinal contours have not significantly changed since 2014. No cardiomegaly. Visualized tracheal air column is within normal limits. Patchy and indistinct bilateral mid and lower lung opacity. No pneumothorax or pleural effusion. Compared to 01/27/2020, ventilation is stable. No acute osseous abnormality identified. Visible bowel-gas pattern within normal limits. IMPRESSION: Stable bilateral COVID-19 pneumonia since 01/27/2020. Electronically Signed   By: Odessa Fleming M.D.   On: 01/29/2020 08:21   US Venous Img Lower Bilateral (DVT)  Result Date: 01/29/2020 CLINICAL DATA:  COVID-19 pneumonia and elevated D-dimer. EXAM: BILATERAL LOWER EXTREMITY VENOUS DOPPLER ULTRASOUND TECHNIQUE: Gray-scale sonography with graded compression, as well as color Doppler and duplex ultrasound were performed to evaluate the lower extremity deep venous systems from the level of the common femoral vein and including the common femoral, femoral, profunda femoral, popliteal and calf veins including the posterior tibial, peroneal and gastrocnemius veins when visible. The superficial great saphenous vein was also interrogated. Spectral Doppler was utilized to evaluate flow at rest and with distal augmentation maneuvers in the common  femoral, femoral and popliteal veins. COMPARISON:  None. FINDINGS: RIGHT LOWER EXTREMITY Common Femoral Vein: No evidence of thrombus. Normal compressibility, respiratory phasicity and response to augmentation. Saphenofemoral Junction: No evidence of thrombus. Normal compressibility and flow on color Doppler imaging. Profunda Femoral Vein: No evidence of thrombus. Normal compressibility and flow on color Doppler imaging. Femoral Vein: No evidence of thrombus. Normal compressibility, respiratory phasicity and response to augmentation. Popliteal Vein: No evidence of thrombus. Normal compressibility, respiratory phasicity and response to augmentation. Calf Veins: No evidence of thrombus. Normal compressibility and flow on color Doppler imaging. Superficial Great Saphenous Vein: No evidence of thrombus. Normal compressibility. Venous Reflux:  None. Other Findings: No evidence of superficial thrombophlebitis or abnormal fluid collection. LEFT LOWER EXTREMITY Common Femoral Vein: No evidence of thrombus. Normal compressibility, respiratory phasicity and response to augmentation. Saphenofemoral Junction: No evidence of thrombus. Normal compressibility and flow on color Doppler imaging. Profunda Femoral Vein: No evidence of thrombus. Normal compressibility and flow on color Doppler imaging. Femoral Vein: No evidence of thrombus. Normal compressibility, respiratory phasicity and response to augmentation. Popliteal Vein: No evidence of thrombus. Normal compressibility, respiratory phasicity and response to augmentation. Calf Veins: No evidence of thrombus. Normal compressibility and flow on color Doppler imaging. Superficial Great Saphenous Vein: No evidence of thrombus. Normal compressibility. Venous Reflux:  None. Other Findings: No evidence of superficial thrombophlebitis or abnormal fluid collection. IMPRESSION: No evidence of deep venous thrombosis in either lower extremity. Electronically Signed   By: Irish Lack  M.D.   On: 01/29/2020 11:44   Assessment & Plan:  35 y.o. G1P0000 female at [redacted]w[redacted]d admitted with COVID19  pneumonia, elevated liver enzymes, She meets moderate COVID 19 disease by CXR and now with ongoing O2 requirement *Pregnancy:Continue fetal monitoring.  GBS pending.   * COVID19 pneumonia: continue decadron.  Remdesivir already received today.  Will rely on pharmacy for protocol based on now increasing LFTs.  Will add sliding scale insulin to cover for decadron.  *Preterm: no issues.  She is getting decadron, which should help with lung maturity if she needs early delivery. - Appreciate hospitalist recs.  *PPx: SCDs, lovenox *FEN/GI: MIVF, regular diet. *Dispo: pending clinical improvement  Thomasene Mohair, MD, [facility], [facility] 01/29/2020 1:33 PM

## 2020-01-29 NOTE — Progress Notes (Addendum)
PROGRESS NOTE    Vicki Mcdowell  EGB:[id number] DOB: [birth year] DOA: 01/27/2020 PCP: Ailene Ravel, MD  Assessment & Plan:   Principal Problem:   COVID-19 affecting pregnancy in third trimester Active Problems:   Supervision of other normal pregnancy, antepartum   Obesity in pregnancy, antepartum, third trimester   VP (ventriculoperitoneal) shunt status   Transaminitis   Pneumonia due to COVID-19 virus   Shortness of breath after COVID-19 vaccination   COVID-19 pneumonia: continue on airborne & contact precautions. Unvaccinated. Started and will continue on IV remdesivir, decadron as per OBGYN. Tussionex prn for cough as per OBGYN. Continue w/ supportive care. Encourage incentive spirometry. Continue on supplemental oxygen and wean as tolerated. Not acute hypoxic respiratory failure    Elevated d-dimer: unable to do CTA chest secondary to pt being pregnant. [place] of b/l LE neg for DVTs. Continue on lovenox   Transaminitis: likely secondary to COVID19 & remdesivir use.  Will continue to monitor closely w/ pharmacy    Hyperbilirubinemia: mild. Etiology unclear. [place] abd shows cholelithiasis & no evidence of cholecystitis   Hyponatremia: resolved   Pregnancy: management by primary team/OBGYN  Obesity: BMI 32.7. Complicates overall care & prognosis    DVT prophylaxis: SCDs, lovenox  Code Status: full  Family Communication:  Disposition Plan: management as per primary team    Status is: Inpatient  Remains inpatient appropriate because:Inpatient level of care appropriate due to severity of illness   Dispo: The patient is from: Home              Anticipated d/c is to: Home              Anticipated d/c date is: as per primary team              Patient currently is not medically stable to d/c.        Consultants:  Hospitalist    Procedures:    Antimicrobials:    Subjective: Pt c/o cough still, unchanged from day prior.   Objective: Vitals:   01/29/20 1324  01/29/20 1329 01/29/20 1334 01/29/20 1339  BP:      Pulse:      Resp:      Temp:      TempSrc:      SpO2: 95% (!) 88% (!) 89% 91%  Weight:      Height:        Intake/Output Summary (Last 24 hours) at 01/29/2020 1355 Last data filed at 01/29/2020 1327 Gross per 24 hour  Intake -  Output 400 ml  Net -400 ml   Filed Weights   01/27/20 2327  Weight: 86.6 kg    Examination:  General exam: Appears calm & comfortable  Respiratory system: decreased breath sounds b/l  Cardiovascular system: S1 & S2+. No clicks or rubs Gastrointestinal system: Abdomen is pregnant, soft and nontender. Hypoactive bowel sounds heard. Central nervous system: Alert and oriented. Moves all 4 extremities  Psychiatry: Judgement and insight appear normal. Flat mood and affect     Data Reviewed: I have personally reviewed following labs and imaging studies  CBC: Recent Labs  Lab 01/27/20 1638 01/28/20 0606 01/28/20 1523 01/29/20 0647  WBC 6.5 6.8 6.6 8.6  HGB 14.6 12.2 11.8* 12.5  HCT 42.8 35.2* 34.4* 36.8  MCV 86.8 86.3 86.0 87.0  PLT 207 195 188 227   Basic Metabolic Panel: Recent Labs  Lab 01/27/20 1638 01/27/20 1931 01/28/20 0606 01/28/20 1523 01/29/20 0647  NA 131*  --  132*  --  138  K 3.8  --  3.6  --  3.6  CL 103  --  103  --  110  CO2 15*  --  18*  --  16*  GLUCOSE 112*  --  93  --  96  BUN 9  --  8  --  8  CREATININE 0.79  --  0.73 0.71 0.81  CALCIUM 8.4*  --  7.9*  --  8.1*  MG  --  1.7  --   --  1.8   GFR: Estimated Creatinine Clearance: 104.3 mL/min (by C-G formula based on SCr of 0.81 mg/dL). Liver Function Tests: Recent Labs  Lab 01/27/20 1638 01/28/20 0606 01/29/20 0647  AST 274* 256* 611*  ALT 162* 160* 376*  ALKPHOS 160* 134* 144*  BILITOT 1.9* 2.0* 2.1*  PROT 6.7 5.3* 5.3*  ALBUMIN 2.7* 2.1* 1.9*   No results for input(s): LIPASE, AMYLASE in the last 168 hours. No results for input(s): AMMONIA in the last 168 hours. Coagulation Profile: No results for  input(s): INR, PROTIME in the last 168 hours. Cardiac Enzymes: No results for input(s): CKTOTAL, CKMB, CKMBINDEX, TROPONINI in the last 168 hours. BNP (last 3 results) No results for input(s): PROBNP in the last 8760 hours. HbA1C: No results for input(s): HGBA1C in the last 72 hours. CBG: No results for input(s): GLUCAP in the last 168 hours. Lipid Profile: No results for input(s): CHOL, HDL, LDLCALC, TRIG, CHOLHDL, LDLDIRECT in the last 72 hours. Thyroid Function Tests: No results for input(s): TSH, T4TOTAL, FREET4, T3FREE, THYROIDAB in the last 72 hours. Anemia Panel: No results for input(s): VITAMINB12, FOLATE, FERRITIN, TIBC, IRON, RETICCTPCT in the last 72 hours. Sepsis Labs: Recent Labs  Lab 01/27/20 1931 01/27/20 2250  PROCALCITON <0.10  --   LATICACIDVEN 1.9 1.5    Recent Results (from the past 240 hour(s))  Blood culture (routine x 2)     Status: None (Preliminary result)   Collection Time: 01/27/20  7:31 PM   Specimen: BLOOD  Result Value Ref Range Status   Specimen Description BLOOD LUE  Final   Special Requests   Final    BOTTLES DRAWN AEROBIC AND ANAEROBIC Blood Culture results may not be optimal due to an inadequate volume of blood received in culture bottles   Culture   Final    NO GROWTH 2 DAYS Performed at [facility], [address] [place], [place] [address]    Report Status PENDING  Incomplete  Blood culture (routine x 2)     Status: None (Preliminary result)   Collection Time: 01/27/20  7:31 PM   Specimen: BLOOD  Result Value Ref Range Status   Specimen Description BLOOD BLOOD LEFT ARM  Final   Special Requests   Final    BOTTLES DRAWN AEROBIC AND ANAEROBIC Blood Culture results may not be optimal due to an inadequate volume of blood received in culture bottles   Culture   Final    NO GROWTH 2 DAYS Performed at [facility], [address] [place], [place] [address]    Report Status PENDING  Incomplete  Culture, beta strep  (group b only)     Status: None (Preliminary result)   Collection Time: 01/28/20  6:22 PM   Specimen: Vaginal/Rectal; Genital  Result Value Ref Range Status   Specimen Description   Final    VAGINAL/RECTAL Performed at [facility], [address], [place], [place] [address]    Special Requests   Final  NONE Performed at [facility], [address], [place], [place] [address]    Culture   Final    CULTURE REINCUBATED FOR BETTER GROWTH Performed at [facility], [place] [address] [place], [place] [address]    Report Status PENDING  Incomplete         Radiology Studies: DG Chest 2 View  Result Date: 01/29/2020 CLINICAL DATA:  35 year old pregnant female positive COVID-19. Increasing shortness of breath. EXAM: CHEST - 2 VIEW COMPARISON:  Portable chest 01/27/2020 and earlier. FINDINGS: Seated AP and lateral views of the chest. Chronic right neck, chest and abdomen shunt catheter. Lung volumes and mediastinal contours have not significantly changed since 2014. No cardiomegaly. Visualized tracheal air column is within normal limits. Patchy and indistinct bilateral mid and lower lung opacity. No pneumothorax or pleural effusion. Compared to 01/27/2020, ventilation is stable. No acute osseous abnormality identified. Visible bowel-gas pattern within normal limits. IMPRESSION: Stable bilateral COVID-19 pneumonia since 01/27/2020. Electronically Signed   By: Genevie Ann M.D.   On: 01/29/2020 08:21   US Venous Img Lower Bilateral (DVT)  Result Date: 01/29/2020 CLINICAL DATA:  COVID-19 pneumonia and elevated D-dimer. EXAM: BILATERAL LOWER EXTREMITY VENOUS DOPPLER ULTRASOUND TECHNIQUE: Gray-scale sonography with graded compression, as well as color Doppler and duplex ultrasound were performed to evaluate the lower extremity deep venous systems from the level of the common femoral vein and including the common femoral, femoral, profunda femoral, popliteal and calf veins including  the posterior tibial, peroneal and gastrocnemius veins when visible. The superficial great saphenous vein was also interrogated. Spectral Doppler was utilized to evaluate flow at rest and with distal augmentation maneuvers in the common femoral, femoral and popliteal veins. COMPARISON:  None. FINDINGS: RIGHT LOWER EXTREMITY Common Femoral Vein: No evidence of thrombus. Normal compressibility, respiratory phasicity and response to augmentation. Saphenofemoral Junction: No evidence of thrombus. Normal compressibility and flow on color Doppler imaging. Profunda Femoral Vein: No evidence of thrombus. Normal compressibility and flow on color Doppler imaging. Femoral Vein: No evidence of thrombus. Normal compressibility, respiratory phasicity and response to augmentation. Popliteal Vein: No evidence of thrombus. Normal compressibility, respiratory phasicity and response to augmentation. Calf Veins: No evidence of thrombus. Normal compressibility and flow on color Doppler imaging. Superficial Great Saphenous Vein: No evidence of thrombus. Normal compressibility. Venous Reflux:  None. Other Findings: No evidence of superficial thrombophlebitis or abnormal fluid collection. LEFT LOWER EXTREMITY Common Femoral Vein: No evidence of thrombus. Normal compressibility, respiratory phasicity and response to augmentation. Saphenofemoral Junction: No evidence of thrombus. Normal compressibility and flow on color Doppler imaging. Profunda Femoral Vein: No evidence of thrombus. Normal compressibility and flow on color Doppler imaging. Femoral Vein: No evidence of thrombus. Normal compressibility, respiratory phasicity and response to augmentation. Popliteal Vein: No evidence of thrombus. Normal compressibility, respiratory phasicity and response to augmentation. Calf Veins: No evidence of thrombus. Normal compressibility and flow on color Doppler imaging. Superficial Great Saphenous Vein: No evidence of thrombus. Normal  compressibility. Venous Reflux:  None. Other Findings: No evidence of superficial thrombophlebitis or abnormal fluid collection. IMPRESSION: No evidence of deep venous thrombosis in either lower extremity. Electronically Signed   By: Aletta Edouard M.D.   On: 01/29/2020 11:44   DG Chest Port 1 View  Result Date: 01/27/2020 CLINICAL DATA:  Shortness of breath COVID positive [redacted] weeks pregnant EXAM: PORTABLE CHEST 1 VIEW COMPARISON:  02/02/2012 FINDINGS: Right-sided shunt catheter. Mild patchy foci of airspace disease bilaterally. Normal heart size. No pneumothorax. IMPRESSION: Mild patchy  foci of airspace disease bilaterally, suspect for multifocal pneumonia. Electronically Signed   By: Jasmine Pang M.D.   On: 01/27/2020 18:21   US Abdomen Limited RUQ (LIVER/GB)  Result Date: 01/27/2020 CLINICAL DATA:  Right upper quadrant pain and chest pain. COVID positive. Pregnant. EXAM: ULTRASOUND ABDOMEN LIMITED RIGHT UPPER QUADRANT COMPARISON:  None. FINDINGS: Gallbladder: Cholelithiasis with multiple small stones demonstrated in the gallbladder. No gallbladder wall thickening or edema. Murphy's sign is negative. Common bile duct: Diameter: 2.7 mm, normal Liver: No focal lesion identified. Within normal limits in parenchymal echogenicity. Portal vein is patent on color Doppler imaging with normal direction of blood flow towards the liver. Other: None. IMPRESSION: Cholelithiasis. No evidence of cholecystitis. Electronically Signed   By: Burman Nieves M.D.   On: 01/27/2020 19:42        Scheduled Meds: . vitamin C  250 mg Oral Daily  . dexamethasone (DECADRON) injection  6 mg Intravenous Q24H  . enoxaparin (LOVENOX) injection  40 mg Subcutaneous Q24H  . insulin aspart  0-15 Units Subcutaneous TID WC  . zinc sulfate  220 mg Oral Daily   Continuous Infusions: . sodium chloride 125 mL/hr at 01/29/20 0955  . remdesivir 100 mg in NS 100 mL 100 mg (01/29/20 1037)     LOS: 2 days    Time spent: 34  mins    Charise Killian, MD [facility] Pager [phone]  If 7PM-7AM, please contact night-coverage 01/29/2020, 1:55 PM

## 2020-01-30 DIAGNOSIS — O99353 Diseases of the nervous system complicating pregnancy, third trimester: Secondary | ICD-10-CM

## 2020-01-30 DIAGNOSIS — R7401 Elevation of levels of liver transaminase levels: Secondary | ICD-10-CM | POA: Diagnosis not present

## 2020-01-30 DIAGNOSIS — G919 Hydrocephalus, unspecified: Secondary | ICD-10-CM

## 2020-01-30 DIAGNOSIS — O99213 Obesity complicating pregnancy, third trimester: Secondary | ICD-10-CM | POA: Diagnosis not present

## 2020-01-30 DIAGNOSIS — Z982 Presence of cerebrospinal fluid drainage device: Secondary | ICD-10-CM

## 2020-01-30 DIAGNOSIS — O26613 Liver and biliary tract disorders in pregnancy, third trimester: Secondary | ICD-10-CM

## 2020-01-30 DIAGNOSIS — O98513 Other viral diseases complicating pregnancy, third trimester: Secondary | ICD-10-CM

## 2020-01-30 DIAGNOSIS — Z3A36 36 weeks gestation of pregnancy: Secondary | ICD-10-CM

## 2020-01-30 DIAGNOSIS — U071 COVID-19: Secondary | ICD-10-CM

## 2020-01-30 DIAGNOSIS — J1282 Pneumonia due to coronavirus disease 2019: Secondary | ICD-10-CM

## 2020-01-30 DIAGNOSIS — K8080 Other cholelithiasis without obstruction: Secondary | ICD-10-CM

## 2020-01-30 LAB — CBC
HCT: 41.1 % (ref 36.0–46.0)
Hemoglobin: 13.6 g/dL (ref 12.0–15.0)
MCH: 29.2 pg (ref 26.0–34.0)
MCHC: 33.1 g/dL (ref 30.0–36.0)
MCV: 88.2 fL (ref 80.0–100.0)
Platelets: 339 10*3/uL (ref 150–400)
RBC: 4.66 MIL/uL (ref 3.87–5.11)
RDW: 13.1 % (ref 11.5–15.5)
WBC: 11.8 10*3/uL — ABNORMAL HIGH (ref 4.0–10.5)
nRBC: 1.3 % — ABNORMAL HIGH (ref 0.0–0.2)

## 2020-01-30 LAB — HEPATIC FUNCTION PANEL
ALT: 346 U/L — ABNORMAL HIGH (ref 0–44)
AST: 465 U/L — ABNORMAL HIGH (ref 15–41)
Albumin: 2.1 g/dL — ABNORMAL LOW (ref 3.5–5.0)
Alkaline Phosphatase: 158 U/L — ABNORMAL HIGH (ref 38–126)
Bilirubin, Direct: 1.2 mg/dL — ABNORMAL HIGH (ref 0.0–0.2)
Indirect Bilirubin: 0.9 mg/dL (ref 0.3–0.9)
Total Bilirubin: 2.1 mg/dL — ABNORMAL HIGH (ref 0.3–1.2)
Total Protein: 5.7 g/dL — ABNORMAL LOW (ref 6.5–8.1)

## 2020-01-30 LAB — GLUCOSE, CAPILLARY
Glucose-Capillary: 88 mg/dL (ref 70–99)
Glucose-Capillary: 102 mg/dL — ABNORMAL HIGH (ref 70–99)
Glucose-Capillary: 121 mg/dL — ABNORMAL HIGH (ref 70–99)

## 2020-01-30 LAB — BASIC METABOLIC PANEL
Anion gap: 12 (ref 5–15)
BUN: 11 mg/dL (ref 6–20)
CO2: 15 mmol/L — ABNORMAL LOW (ref 22–32)
Calcium: 8.2 mg/dL — ABNORMAL LOW (ref 8.9–10.3)
Chloride: 114 mmol/L — ABNORMAL HIGH (ref 98–111)
Creatinine, Ser: 0.87 mg/dL (ref 0.44–1.00)
GFR, Estimated: >60 mL/min (ref >60)
Glucose, Bld: 101 mg/dL — ABNORMAL HIGH (ref 70–99)
Potassium: 4 mmol/L (ref 3.5–5.1)
Sodium: 141 mmol/L (ref 135–145)

## 2020-01-30 LAB — CULTURE, BETA STREP (GROUP B ONLY)

## 2020-01-30 NOTE — Progress Notes (Signed)
PROGRESS NOTE    Vicki Mcdowell  IRW:[id number] DOB: [birth year] DOA: 01/27/2020 PCP: Ailene Ravel, MD  Assessment & Plan:   Principal Problem:   COVID-19 affecting pregnancy in third trimester Active Problems:   Supervision of other normal pregnancy, antepartum   Obesity in pregnancy, antepartum, third trimester   VP (ventriculoperitoneal) shunt status   Transaminitis   Pneumonia due to COVID-19 virus   Shortness of breath after COVID-19 vaccination   COVID-19 pneumonia: improving slowly. Continue on airborne & contact precautions. Unvaccinated. Started and will continue on IV remdesivir, decadron as per OBGYN. Tussionex prn for cough as per OBGYN. Continue w/ supportive care. Encourage incentive spirometry. Continue on supplemental oxygen and wean as tolerated. Not acute hypoxic respiratory failure    Elevated d-dimer: unable to do CTA chest secondary to pt being pregnant. [place] of b/l LE neg for DVTs. Continue on lovenox   Transaminitis: likely secondary to COVID19 and remdesivir use. Still elevated but trending down today. Will continue to monitor closely w/ pharmacy   Hyperbilirubinemia: mild and stable. Etiology unclear. [place] abd shows cholelithiasis & no evidence of cholecystitis   Hyponatremia: resolved   Pregnancy: management per primary team/OBGYN  Obesity: BMI 32.7. Complicates overall care & prognosis    DVT prophylaxis: SCDs, lovenox  Code Status: full  Family Communication:  Disposition Plan: management as per primary team    Status is: Inpatient  Remains inpatient appropriate because:Inpatient level of care appropriate due to severity of illness   Dispo: The patient is from: Home              Anticipated d/c is to: Home              Anticipated d/c date is: as per primary team              Patient currently is not medically stable to d/c.        Consultants:  Hospitalist    Procedures:    Antimicrobials:    Subjective: Pt c/o cough  still, unchanged from day prior.   Objective: Vitals:   01/30/20 0754 01/30/20 0759 01/30/20 0804 01/30/20 0809  BP:      Pulse:      Resp:      Temp:      TempSrc:      SpO2: 94% 94% 91% 92%  Weight:      Height:        Intake/Output Summary (Last 24 hours) at 01/30/2020 0839 Last data filed at 01/30/2020 7619 Gross per 24 hour  Intake 4521.89 ml  Output 1700 ml  Net 2821.89 ml   Filed Weights   01/27/20 2327  Weight: 86.6 kg    Examination:  General exam: Appears calm & comfortable  Respiratory system: diminished breath sounds b/l otherwise clear  Cardiovascular system: S1 & S2+. No clicks or rubs Gastrointestinal system: Abdomen is pregnant, soft and nontender. Hypoactive bowel sounds heard. Central nervous system: Alert and oriented. Moves all 4 extremities  Psychiatry: Judgement and insight appear normal. Flat mood and affect     Data Reviewed: I have personally reviewed following labs and imaging studies  CBC: Recent Labs  Lab 01/27/20 1638 01/28/20 0606 01/28/20 1523 01/29/20 0647 01/30/20 0657  WBC 6.5 6.8 6.6 8.6 11.8*  HGB 14.6 12.2 11.8* 12.5 13.6  HCT 42.8 35.2* 34.4* 36.8 41.1  MCV 86.8 86.3 86.0 87.0 88.2  PLT 207 195 188 227 339   Basic Metabolic Panel: Recent Labs  Lab 01/27/20  1638 01/27/20 1931 01/28/20 0606 01/28/20 1523 01/29/20 0647 01/30/20 0657  NA 131*  --  132*  --  138 141  K 3.8  --  3.6  --  3.6 4.0  CL 103  --  103  --  110 114*  CO2 15*  --  18*  --  16* 15*  GLUCOSE 112*  --  93  --  96 101*  BUN 9  --  8  --  8 11  CREATININE 0.79  --  0.73 0.71 0.81 0.87  CALCIUM 8.4*  --  7.9*  --  8.1* 8.2*  MG  --  1.7  --   --  1.8  --    GFR: Estimated Creatinine Clearance: 97.1 mL/min (by C-G formula based on SCr of 0.87 mg/dL). Liver Function Tests: Recent Labs  Lab 01/27/20 1638 01/28/20 0606 01/29/20 0647 01/30/20 0657  AST 274* 256* 611* 465*  ALT 162* 160* 376* 346*  ALKPHOS 160* 134* 144* 158*  BILITOT 1.9*  2.0* 2.1* 2.1*  PROT 6.7 5.3* 5.3* 5.7*  ALBUMIN 2.7* 2.1* 1.9* 2.1*   No results for input(s): LIPASE, AMYLASE in the last 168 hours. No results for input(s): AMMONIA in the last 168 hours. Coagulation Profile: No results for input(s): INR, PROTIME in the last 168 hours. Cardiac Enzymes: No results for input(s): CKTOTAL, CKMB, CKMBINDEX, TROPONINI in the last 168 hours. BNP (last 3 results) No results for input(s): PROBNP in the last 8760 hours. HbA1C: No results for input(s): HGBA1C in the last 72 hours. CBG: Recent Labs  Lab 01/29/20 1509 01/29/20 1930 01/29/20 2319  GLUCAP 90 93 101*   Lipid Profile: No results for input(s): CHOL, HDL, LDLCALC, TRIG, CHOLHDL, LDLDIRECT in the last 72 hours. Thyroid Function Tests: No results for input(s): TSH, T4TOTAL, FREET4, T3FREE, THYROIDAB in the last 72 hours. Anemia Panel: No results for input(s): VITAMINB12, FOLATE, FERRITIN, TIBC, IRON, RETICCTPCT in the last 72 hours. Sepsis Labs: Recent Labs  Lab 01/27/20 1931 01/27/20 2250  PROCALCITON <0.10  --   LATICACIDVEN 1.9 1.5    Recent Results (from the past 240 hour(s))  Blood culture (routine x 2)     Status: None (Preliminary result)   Collection Time: 01/27/20  7:31 PM   Specimen: BLOOD  Result Value Ref Range Status   Specimen Description BLOOD LUE  Final   Special Requests   Final    BOTTLES DRAWN AEROBIC AND ANAEROBIC Blood Culture results may not be optimal due to an inadequate volume of blood received in culture bottles   Culture   Final    NO GROWTH 3 DAYS Performed at [facility], [address], [place], [place] [address]    Report Status PENDING  Incomplete  Blood culture (routine x 2)     Status: None (Preliminary result)   Collection Time: 01/27/20  7:31 PM   Specimen: BLOOD  Result Value Ref Range Status   Specimen Description BLOOD BLOOD LEFT ARM  Final   Special Requests   Final    BOTTLES DRAWN AEROBIC AND ANAEROBIC Blood Culture results  may not be optimal due to an inadequate volume of blood received in culture bottles   Culture   Final    NO GROWTH 3 DAYS Performed at [facility], [address], [place], [place] [address]    Report Status PENDING  Incomplete  Culture, beta strep (group b only)     Status: None   Collection Time: 01/28/20  6:22  PM   Specimen: Vaginal/Rectal; Genital  Result Value Ref Range Status   Specimen Description   Final    VAGINAL/RECTAL Performed at [facility], [address] [place], [place] [address]    Special Requests   Final    NONE Performed at [facility], [address] [place], [place] [address]    Culture   Final    NO GROUP B STREP (S.AGALACTIAE) ISOLATED Performed at [facility], [address] [place], [place] [address]    Report Status 01/30/2020 FINAL  Final         Radiology Studies: DG Chest 2 View  Result Date: 01/29/2020 CLINICAL DATA:  35 year old pregnant female positive COVID-19. Increasing shortness of breath. EXAM: CHEST - 2 VIEW COMPARISON:  Portable chest 01/27/2020 and earlier. FINDINGS: Seated AP and lateral views of the chest. Chronic right neck, chest and abdomen shunt catheter. Lung volumes and mediastinal contours have not significantly changed since 2014. No cardiomegaly. Visualized tracheal air column is within normal limits. Patchy and indistinct bilateral mid and lower lung opacity. No pneumothorax or pleural effusion. Compared to 01/27/2020, ventilation is stable. No acute osseous abnormality identified. Visible bowel-gas pattern within normal limits. IMPRESSION: Stable bilateral COVID-19 pneumonia since 01/27/2020. Electronically Signed   By: Odessa Fleming M.D.   On: 01/29/2020 08:21   US Venous Img Lower Bilateral (DVT)  Result Date: 01/29/2020 CLINICAL DATA:  COVID-19 pneumonia and elevated D-dimer. EXAM: BILATERAL LOWER EXTREMITY VENOUS DOPPLER ULTRASOUND TECHNIQUE: Gray-scale sonography with graded compression,  as well as color Doppler and duplex ultrasound were performed to evaluate the lower extremity deep venous systems from the level of the common femoral vein and including the common femoral, femoral, profunda femoral, popliteal and calf veins including the posterior tibial, peroneal and gastrocnemius veins when visible. The superficial great saphenous vein was also interrogated. Spectral Doppler was utilized to evaluate flow at rest and with distal augmentation maneuvers in the common femoral, femoral and popliteal veins. COMPARISON:  None. FINDINGS: RIGHT LOWER EXTREMITY Common Femoral Vein: No evidence of thrombus. Normal compressibility, respiratory phasicity and response to augmentation. Saphenofemoral Junction: No evidence of thrombus. Normal compressibility and flow on color Doppler imaging. Profunda Femoral Vein: No evidence of thrombus. Normal compressibility and flow on color Doppler imaging. Femoral Vein: No evidence of thrombus. Normal compressibility, respiratory phasicity and response to augmentation. Popliteal Vein: No evidence of thrombus. Normal compressibility, respiratory phasicity and response to augmentation. Calf Veins: No evidence of thrombus. Normal compressibility and flow on color Doppler imaging. Superficial Great Saphenous Vein: No evidence of thrombus. Normal compressibility. Venous Reflux:  None. Other Findings: No evidence of superficial thrombophlebitis or abnormal fluid collection. LEFT LOWER EXTREMITY Common Femoral Vein: No evidence of thrombus. Normal compressibility, respiratory phasicity and response to augmentation. Saphenofemoral Junction: No evidence of thrombus. Normal compressibility and flow on color Doppler imaging. Profunda Femoral Vein: No evidence of thrombus. Normal compressibility and flow on color Doppler imaging. Femoral Vein: No evidence of thrombus. Normal compressibility, respiratory phasicity and response to augmentation. Popliteal Vein: No evidence of thrombus.  Normal compressibility, respiratory phasicity and response to augmentation. Calf Veins: No evidence of thrombus. Normal compressibility and flow on color Doppler imaging. Superficial Great Saphenous Vein: No evidence of thrombus. Normal compressibility. Venous Reflux:  None. Other Findings: No evidence of superficial thrombophlebitis or abnormal fluid collection. IMPRESSION: No evidence of deep venous thrombosis in either lower extremity. Electronically Signed   By: Irish Lack M.D.   On: 01/29/2020 11:44  Scheduled Meds: . vitamin C  250 mg Oral Daily  . dexamethasone (DECADRON) injection  6 mg Intravenous Q24H  . enoxaparin (LOVENOX) injection  40 mg Subcutaneous Q24H  . insulin aspart  0-15 Units Subcutaneous TID WC  . zinc sulfate  220 mg Oral Daily   Continuous Infusions: . sodium chloride 125 mL/hr at 01/30/20 0728  . remdesivir 100 mg in NS 100 mL Stopped (01/29/20 1107)     LOS: 3 days    Time spent: 32 mins    Wyvonnia Dusky, MD [facility] Pager [phone]  If 7PM-7AM, please contact night-coverage 01/30/2020, 8:39 AM

## 2020-01-30 NOTE — Progress Notes (Addendum)
From 2013-2016, 2.5-3 min deceleration down to 45 bpm noted, RN at the bedside. Pt stated that she just dozed off, she wasn't coughing or changing positions. Heart rate recovered with a baseline changed. Provider notified by phone, M. Eunice Blase, CNM and reported to the floor.

## 2020-01-30 NOTE — Progress Notes (Signed)
Pt fasting BG was 88 at 0840. Pt refused to order breakfast so RN did not get a 2 hour postprandial.  Will check a BG after pat eats lunch.

## 2020-01-30 NOTE — Progress Notes (Signed)
Subjective: Patient reports she continues to cough and have difficulty when getting up to the bathroom. Her baby is moving well, and she denies feeling any contractions. She expresses a lack of understanding regarding the POC for herself.    Objective: I have reviewed patient's vital signs, medications, labs and the EFM strip  FHTs baseline 120 with moderate variability, no decels and accels present. category 1.. Temp:  [97.7 F (36.5 C)-98.6 F (37 C)] 98 F (36.7 C) (01/03 0849) Pulse Rate:  [77-91] 91 (01/03 0849) Resp:  [18-30] 20 (01/03 0849) BP: (102-121)/(62-81) 121/72 (01/03 0849) SpO2:  [86 %-98 %] 97 % (01/03 0944)  General: alert, fatigued and moderately obese Resp: mildly tachypnic this hour. O2 stas: 93% at present. Lung sounds slightly diminished. Cardio: regular rate and rhythm, S1, S2 normal, no murmur, click, rub or gallop GI: soft, non-tender; bowel sounds normal; no masses,  no organomegaly Extremities: extremities normal, atraumatic, no cyanosis or edema and Homans sign is negative, no sign of DVT wearring SCD hose   Assessment/Plan: IUP 36 weeks 5days COVID pneumonia being treated with O2 via [place], Remdesevir, Dexamethasone, Decadron, Zinc and Vitamin C Has a VP Shunt Transaminitis  Goal: Maintain O2 stas above 90, or ideally for pregnancy 94% Continuous EFM Sliding scale insulin orders continue. Will monitor LFTs Encouraged pateint to discuss the POC for her COVID care with the Hospitalists.     Mirna Mires 01/30/2020, 10:52 AM

## 2020-01-30 NOTE — Progress Notes (Addendum)
Pt is feeling much better this afternoon. Pt has maintained her Spo2 in the mid 90's today on 2L nasal cannula even with getting up to the bathroom. Pt is not as SOB as she was this AM. Pt 2hr pp CBG was 102, no coverage needed.

## 2020-01-30 NOTE — Progress Notes (Signed)
Patient ID: Vicki Mcdowell, female   DOB: Feb 08, 1985, 35 y.o.   MRN: [id number] S;  Called to see fetal monitor tracing. Patient has been resting quietly in semi fowlers and denies any contractions. Her baby is moving well.  O:  EFM baseline 125 with moderate variability at apporximately 2023 a  spontaneous decel to the 40s of 2.5 minutes duration.Spontaneous rebound to 150s for several minutes, with gradual return to the previous 125 baseline FHT.  A; spontaneous decel of FHTs-      Overall reassuring FHTS today.  P will continue to monitor EFM carefully7. Patient positioned slightly to one side for optimal blood flow and oxygen perfusion.  Continue other current  Orders.  Mirna Mires, CNM  01/30/2020 9:01 PM

## 2020-01-31 ENCOUNTER — Encounter: Payer: Self-pay | Admitting: Obstetrics and Gynecology

## 2020-01-31 DIAGNOSIS — R7401 Elevation of levels of liver transaminase levels: Secondary | ICD-10-CM | POA: Diagnosis not present

## 2020-01-31 DIAGNOSIS — O26613 Liver and biliary tract disorders in pregnancy, third trimester: Secondary | ICD-10-CM | POA: Diagnosis not present

## 2020-01-31 DIAGNOSIS — U071 COVID-19: Secondary | ICD-10-CM | POA: Diagnosis not present

## 2020-01-31 DIAGNOSIS — O98513 Other viral diseases complicating pregnancy, third trimester: Secondary | ICD-10-CM | POA: Diagnosis not present

## 2020-01-31 DIAGNOSIS — O99213 Obesity complicating pregnancy, third trimester: Secondary | ICD-10-CM | POA: Diagnosis not present

## 2020-01-31 LAB — HEPATIC FUNCTION PANEL
ALT: 227 U/L — ABNORMAL HIGH (ref 0–44)
AST: 208 U/L — ABNORMAL HIGH (ref 15–41)
Albumin: 1.6 g/dL — ABNORMAL LOW (ref 3.5–5.0)
Alkaline Phosphatase: 126 U/L (ref 38–126)
Bilirubin, Direct: 0.8 mg/dL — ABNORMAL HIGH (ref 0.0–0.2)
Indirect Bilirubin: 0.6 mg/dL (ref 0.3–0.9)
Total Bilirubin: 1.4 mg/dL — ABNORMAL HIGH (ref 0.3–1.2)
Total Protein: 4.5 g/dL — ABNORMAL LOW (ref 6.5–8.1)

## 2020-01-31 LAB — GLUCOSE, CAPILLARY
Glucose-Capillary: 95 mg/dL (ref 70–99)
Glucose-Capillary: 87 mg/dL (ref 70–99)
Glucose-Capillary: 84 mg/dL (ref 70–99)
Glucose-Capillary: 92 mg/dL (ref 70–99)

## 2020-01-31 LAB — BASIC METABOLIC PANEL
Anion gap: 7 (ref 5–15)
BUN: 12 mg/dL (ref 6–20)
CO2: 17 mmol/L — ABNORMAL LOW (ref 22–32)
Calcium: 7.8 mg/dL — ABNORMAL LOW (ref 8.9–10.3)
Chloride: 115 mmol/L — ABNORMAL HIGH (ref 98–111)
Creatinine, Ser: 0.65 mg/dL (ref 0.44–1.00)
GFR, Estimated: >60 mL/min (ref >60)
Glucose, Bld: 99 mg/dL (ref 70–99)
Potassium: 3.8 mmol/L (ref 3.5–5.1)
Sodium: 139 mmol/L (ref 135–145)

## 2020-01-31 LAB — CBC
HCT: 35 % — ABNORMAL LOW (ref 36.0–46.0)
Hemoglobin: 12 g/dL (ref 12.0–15.0)
MCH: 29.8 pg (ref 26.0–34.0)
MCHC: 34.3 g/dL (ref 30.0–36.0)
MCV: 86.8 fL (ref 80.0–100.0)
Platelets: 285 10*3/uL (ref 150–400)
RBC: 4.03 MIL/uL (ref 3.87–5.11)
RDW: 13.1 % (ref 11.5–15.5)
WBC: 8.4 10*3/uL (ref 4.0–10.5)
nRBC: 1.9 % — ABNORMAL HIGH (ref 0.0–0.2)

## 2020-01-31 MED ORDER — ENSURE ENLIVE PO LIQD
237.0000 mL | Freq: Two times a day (BID) | ORAL | Status: DC
  Administered 2020-01-31 – 2020-02-01 (×2): 237 mL via ORAL

## 2020-01-31 NOTE — Progress Notes (Signed)
PROGRESS NOTE    Vicki Mcdowell  URK:[id number] DOB: 1985/11/27 DOA: 01/27/2020 PCP: Ailene Ravel, MD  Assessment & Plan:   Principal Problem:   COVID-19 affecting pregnancy in third trimester Active Problems:   Supervision of other normal pregnancy, antepartum   Obesity in pregnancy, antepartum, third trimester   VP (ventriculoperitoneal) shunt status   Transaminitis   Pneumonia due to COVID-19 virus   Shortness of breath after COVID-19 vaccination   COVID-19 pneumonia: improving slowly. Continue on airborne & contact precautions. Unvaccinated. Started and will continue on IV remdesivir, decadron as per OBGYN. Tussionex prn for cough as per OBGYN. Continue w/ supportive care. Encourage incentive spirometry. Continue on supplemental oxygen and wean as tolerated.  Elevated d-dimer: unable to do CTA chest secondary to pt being pregnant. [place] of b/l LE neg for DVTs. Continue on lovenox   Transaminitis: likely secondary to COVID19 & remdesivir use. Still elevated but trending down   Hyperbilirubinemia: trending down today. Etiology unclear. [place] abd shows cholelithiasis & no evidence of cholecystitis   Hyponatremia: resolved   Pregnancy: management per primary team/OBGYN  Obesity: BMI 32.7. Complicates overall care & prognosis    DVT prophylaxis: SCDs, lovenox  Code Status: full  Family Communication:  Disposition Plan: management as per primary team/OBGYN    Status is: Inpatient  Remains inpatient appropriate because:Inpatient level of care appropriate due to severity of illness   Dispo: The patient is from: Home              Anticipated d/c is to: Home              Anticipated d/c date is: as per primary team              Patient currently is not medically stable to d/c.        Consultants:  Hospitalist    Procedures:    Antimicrobials:    Subjective: Pt c/o fatigue   Objective: Vitals:   01/31/20 1219 01/31/20 1224 01/31/20 1229 01/31/20 1234   BP: 114/67     Pulse: 79     Resp: (!) 28     Temp: 97.7 F (36.5 C)     TempSrc: Oral     SpO2: 93% 91% (!) 89% (!) 89%  Weight:      Height:        Intake/Output Summary (Last 24 hours) at 01/31/2020 1553 Last data filed at 01/30/2020 1758 Gross per 24 hour  Intake --  Output 300 ml  Net -300 ml   Filed Weights   01/27/20 2327  Weight: 86.6 kg    Examination:  General exam: Appears calm & comfortable   Respiratory system: decreased breath sounds b/l otherwise clear  Cardiovascular system: S1/S2+. No rubs or gallops  Gastrointestinal system: Abdomen is pregnant, soft and nontender. Hypoactive bowel sounds heard. Central nervous system: Alert and oriented. Moves all 4 extremities   Psychiatry: Judgement and insight appear normal. Flat mood and affect     Data Reviewed: I have personally reviewed following labs and imaging studies  CBC: Recent Labs  Lab 01/28/20 0606 01/28/20 1523 01/29/20 0647 01/30/20 0657 01/31/20 0323  WBC 6.8 6.6 8.6 11.8* 8.4  HGB 12.2 11.8* 12.5 13.6 12.0  HCT 35.2* 34.4* 36.8 41.1 35.0*  MCV 86.3 86.0 87.0 88.2 86.8  PLT 195 188 227 339 285   Basic Metabolic Panel: Recent Labs  Lab 01/27/20 1638 01/27/20 1931 01/28/20 0606 01/28/20 1523 01/29/20 8315 01/30/20 1761 01/31/20 6073  NA 131*  --  132*  --  138 141 139  K 3.8  --  3.6  --  3.6 4.0 3.8  CL 103  --  103  --  110 114* 115*  CO2 15*  --  18*  --  16* 15* 17*  GLUCOSE 112*  --  93  --  96 101* 99  BUN 9  --  8  --  8 11 12   CREATININE 0.79  --  0.73 0.71 0.81 0.87 0.65  CALCIUM 8.4*  --  7.9*  --  8.1* 8.2* 7.8*  MG  --  1.7  --   --  1.8  --   --    GFR: Estimated Creatinine Clearance: 105.6 mL/min (by C-G formula based on SCr of 0.65 mg/dL). Liver Function Tests: Recent Labs  Lab 01/27/20 1638 01/28/20 0606 01/29/20 0647 01/30/20 0657 01/31/20 0323  AST 274* 256* 611* 465* 208*  ALT 162* 160* 376* 346* 227*  ALKPHOS 160* 134* 144* 158* 126  BILITOT 1.9*  2.0* 2.1* 2.1* 1.4*  PROT 6.7 5.3* 5.3* 5.7* 4.5*  ALBUMIN 2.7* 2.1* 1.9* 2.1* 1.6*   No results for input(s): LIPASE, AMYLASE in the last 168 hours. No results for input(s): AMMONIA in the last 168 hours. Coagulation Profile: No results for input(s): INR, PROTIME in the last 168 hours. Cardiac Enzymes: No results for input(s): CKTOTAL, CKMB, CKMBINDEX, TROPONINI in the last 168 hours. BNP (last 3 results) No results for input(s): PROBNP in the last 8760 hours. HbA1C: No results for input(s): HGBA1C in the last 72 hours. CBG: Recent Labs  Lab 01/30/20 1553 01/30/20 1951 01/31/20 0539 01/31/20 0933 01/31/20 1223  GLUCAP 102* 121* 95 87 84   Lipid Profile: No results for input(s): CHOL, HDL, LDLCALC, TRIG, CHOLHDL, LDLDIRECT in the last 72 hours. Thyroid Function Tests: No results for input(s): TSH, T4TOTAL, FREET4, T3FREE, THYROIDAB in the last 72 hours. Anemia Panel: No results for input(s): VITAMINB12, FOLATE, FERRITIN, TIBC, IRON, RETICCTPCT in the last 72 hours. Sepsis Labs: Recent Labs  Lab 01/27/20 1931 01/27/20 2250  PROCALCITON <0.10  --   LATICACIDVEN 1.9 1.5    Recent Results (from the past 240 hour(s))  Blood culture (routine x 2)     Status: None (Preliminary result)   Collection Time: 01/27/20  7:31 PM   Specimen: BLOOD  Result Value Ref Range Status   Specimen Description BLOOD LUE  Final   Special Requests   Final    BOTTLES DRAWN AEROBIC AND ANAEROBIC Blood Culture results may not be optimal due to an inadequate volume of blood received in culture bottles   Culture   Final    NO GROWTH 4 DAYS Performed at [facility], [address] [place], [place] [address]    Report Status PENDING  Incomplete  Blood culture (routine x 2)     Status: None (Preliminary result)   Collection Time: 01/27/20  7:31 PM   Specimen: BLOOD  Result Value Ref Range Status   Specimen Description BLOOD BLOOD LEFT ARM  Final   Special Requests   Final    BOTTLES  DRAWN AEROBIC AND ANAEROBIC Blood Culture results may not be optimal due to an inadequate volume of blood received in culture bottles   Culture   Final    NO GROWTH 4 DAYS Performed at [facility], [address], [place], [place] [address]    Report Status PENDING  Incomplete  Culture, beta strep (group b only)  Status: None   Collection Time: 01/28/20  6:22 PM   Specimen: Vaginal/Rectal; Genital  Result Value Ref Range Status   Specimen Description   Final    VAGINAL/RECTAL Performed at [facility], [address], [place], [place] [address]    Special Requests   Final    NONE Performed at [facility], [address], [place], [place] [address]    Culture   Final    NO GROUP B STREP (S.AGALACTIAE) ISOLATED Performed at [facility], [address] [place], [place] [address]    Report Status 01/30/2020 FINAL  Final         Radiology Studies: No results found.      Scheduled Meds: . vitamin C  250 mg Oral Daily  . dexamethasone (DECADRON) injection  6 mg Intravenous Q24H  . enoxaparin (LOVENOX) injection  40 mg Subcutaneous Q24H  . feeding supplement  237 mL Oral BID BM  . insulin aspart  0-15 Units Subcutaneous TID WC  . zinc sulfate  220 mg Oral Daily   Continuous Infusions: . sodium chloride 125 mL/hr at 01/31/20 0928  . remdesivir 100 mg in NS 100 mL 100 mg (01/31/20 1220)     LOS: 4 days    Time spent: 32 mins    Charise Killian, MD [facility] Pager [phone]  If 7PM-7AM, please contact night-coverage 01/31/2020, 3:53 PM

## 2020-01-31 NOTE — Progress Notes (Signed)
From 0027-0037, 5-6 min deceleration down to 60 bpm noted, RN's at the bedside. Provider Eunice Blase, CNM was called. Pt was giving a 500 ml bolus at 0032. Pt was placed in a left tilt position at 0033. Increased to 3L of O2 at 0035. Pt was turned to left lateral position 0035. Increased to 4L of O2 at 0036. Increased to 5L of O2 at 0037. Provider, Eunice Blase CNM at bedside at [phone]. Increased to 6L of O2 at 0040. Heart rate recovered with a baseline changed. Dr. Tiburcio Pea was contacted. Hospitalist was contacted. Recommended heat high flow oxygen. RT reported to the floor 0200. Per RT pt is on 45 at 60%, if pt sats drop while sleeping please contact RT. RT will come and adjust.

## 2020-01-31 NOTE — Progress Notes (Signed)
Subjective: Called to bedside secondary to a spontaneous decel of the patient's fetal heart tones. She had just gotten up to the bathroom and returned to bed when the decel occurred. The fetal heart tones dropped to 50 and were down for over 4 minutes. She denied any contractions or pelvic pain or pressure.    Objective:  FHts baseline of 150  With spontaneous drop to 50s for over 4 minutes. The FHTS returned to baseline after several position changes, and initiation of an IV bolus.  Fhts returned to her previoius baseline. O2 sats down to 89%. Oxygen rate increased to 6 liters via [place], with ris in her O2 sats to 96 %    General: cooperative, fatigued, no distress and moderately obese Resp: diminished breath sounds bilaterally and some tachypnia Cardio: regular rate and rhythm, S1, S2 normal, no murmur, click, rub or gallop  FHTS now 150s with moderate variability and accels noted.   Assessment/Plan: Active COVID pneumonia at 36 w 6 days Spontaneous prolongued  decel of FHTS corresponding with drop in maternal O2 saturation levels.  P:  Dr. Tiburcio Pea notified via phone of maternal/fetal status. [facility] Hospitalist contacted- order for Respiratory Therapy to provided heated high flow oxygen. Will aim for O2 Sats of 94% or higher. Continue to monitor FHTs closely.   LOS: 4 days    Mirna Mires 01/31/2020, 1:33 AM

## 2020-01-31 NOTE — Progress Notes (Signed)
Subjective:  She is sitting upright in bed. She denies labored breathing. + Cough. She reports normal fetal movement. She denies contractions. She denies leakage of fluid. She denies vaginal bleeding.    Objective:   Blood pressure 114/67, pulse 79, temperature 97.7 F (36.5 C), temperature source Oral, resp. rate (!) 28, height 5\' 4"  (1.626 m), weight 86.6 kg, last menstrual period 05/18/2019, SpO2 (!) 89 %.  General: NAD Pulmonary: no increased work of breathing. Diminished lung sounds bilaterally  Abdomen: non-distended, non-tender, gravid Extremities: mild pedal edema, no erythema, no tenderness, no signs of DVT  Results for orders placed or performed during the hospital encounter of 01/27/20 (from the past 72 hour(s))  CBC     Status: Abnormal   Collection Time: 01/28/20  3:23 PM  Result Value Ref Range   WBC 6.6 4.0 - 10.5 K/uL   RBC 4.00 3.87 - 5.11 MIL/uL   Hemoglobin 11.8 (L) 12.0 - 15.0 g/dL   HCT 34.4 (L) 36.0 - 46.0 %   MCV 86.0 80.0 - 100.0 fL   MCH 29.5 26.0 - 34.0 pg   MCHC 34.3 30.0 - 36.0 g/dL   RDW 12.8 11.5 - 15.5 %   Platelets 188 150 - 400 K/uL   nRBC 0.3 (H) 0.0 - 0.2 %    Comment: Performed at [facility], [address] [place], [place] [address]  Creatinine, serum     Status: None   Collection Time: 01/28/20  3:23 PM  Result Value Ref Range   Creatinine, Ser 0.71 0.44 - 1.00 mg/dL   GFR, Estimated >60 >60 mL/min    Comment: (NOTE) Calculated using the CKD-EPI Creatinine Equation (2021) Performed at [facility], [address] [place], [place] [address]   Culture, beta strep (group b only)     Status: None   Collection Time: 01/28/20  6:22 PM   Specimen: Vaginal/Rectal; Genital  Result Value Ref Range   Specimen Description      VAGINAL/RECTAL Performed at [facility], [address] [place], [place] [address]    Special Requests      NONE Performed at [facility], [address],  [place], [place] [address]    Culture      NO GROUP B STREP (S.AGALACTIAE) ISOLATED Performed at [facility], [place] [address] [place],  [address]    Report Status 01/30/2020 FINAL   Fibrin derivatives D-Dimer ([facility] only)     Status: Abnormal   Collection Time: 01/29/20  6:47 AM  Result Value Ref Range   Fibrin derivatives D-dimer ([facility]) 2,255.97 (H) 0.00 - 499.00 ng/mL (FEU)    Comment: (NOTE) <> Exclusion of Venous Thromboembolism (VTE) - OUTPATIENT ONLY   (Emergency Department or [place])    0-499 ng/ml (FEU): With a low to intermediate pretest probability                      for VTE this test result excludes the diagnosis                      of VTE.   >499 ng/ml (FEU) : VTE not excluded; additional work up for VTE is                      required.  <> Testing on Inpatients and Evaluation of Disseminated Intravascular   Coagulation (DIC) Reference Range:   0-499 ng/ml (FEU) Performed at [facility],  [address] [place], [place] [address]   C-reactive protein     Status: Abnormal   Collection Time: 01/29/20  6:47 AM  Result Value Ref Range   CRP 10.4 (H) <1.0 mg/dL    Comment: Performed at [facility], [address], [place], [place] [address]  Magnesium     Status: None   Collection Time: 01/29/20  6:47 AM  Result Value Ref Range   Magnesium 1.8 1.7 - 2.4 mg/dL    Comment: Performed at [facility], [address] [place], [place] [address]  CBC     Status: None   Collection Time: 01/29/20  6:47 AM  Result Value Ref Range   WBC 8.6 4.0 - 10.5 K/uL   RBC 4.23 3.87 - 5.11 MIL/uL   Hemoglobin 12.5 12.0 - 15.0 g/dL   HCT 21.3 08.6 - 57.8 %   MCV 87.0 80.0 - 100.0 fL   MCH 29.6 26.0 - 34.0 pg   MCHC 34.0 30.0 - 36.0 g/dL   RDW 46.9 62.9 - 52.8 %   Platelets 227 150 - 400 K/uL   nRBC 0.2 0.0 - 0.2 %    Comment: Performed at [facility], [address] [place], [place] [address]  Basic metabolic panel     Status:  Abnormal   Collection Time: 01/29/20  6:47 AM  Result Value Ref Range   Sodium 138 135 - 145 mmol/L   Potassium 3.6 3.5 - 5.1 mmol/L   Chloride 110 98 - 111 mmol/L   CO2 16 (L) 22 - 32 mmol/L   Glucose, Bld 96 70 - 99 mg/dL    Comment: Glucose reference range applies only to samples taken after fasting for at least 8 hours.   BUN 8 6 - 20 mg/dL   Creatinine, Ser 4.01 0.44 - 1.00 mg/dL   Calcium 8.1 (L) 8.9 - 10.3 mg/dL   GFR, Estimated >02 >72 mL/min    Comment: (NOTE) Calculated using the CKD-EPI Creatinine Equation (2021)    Anion gap 12 5 - 15    Comment: Performed at [facility], [address] [place], [place] [address]  Hepatic function panel     Status: Abnormal   Collection Time: 01/29/20  6:47 AM  Result Value Ref Range   Total Protein 5.3 (L) 6.5 - 8.1 g/dL   Albumin 1.9 (L) 3.5 - 5.0 g/dL   AST 403 (H) 15 - 41 U/L   ALT 376 (H) 0 - 44 U/L   Alkaline Phosphatase 144 (H) 38 - 126 U/L   Total Bilirubin 2.1 (H) 0.3 - 1.2 mg/dL   Bilirubin, Direct 1.3 (H) 0.0 - 0.2 mg/dL   Indirect Bilirubin 0.8 0.3 - 0.9 mg/dL    Comment: Performed at [facility], [address] [place], [place] [address]  Glucose, capillary     Status: None   Collection Time: 01/29/20  3:09 PM  Result Value Ref Range   Glucose-Capillary 90 70 - 99 mg/dL    Comment: Glucose reference range applies only to samples taken after fasting for at least 8 hours.  Glucose, capillary     Status: None   Collection Time: 01/29/20  7:30 PM  Result Value Ref Range   Glucose-Capillary 93 70 - 99 mg/dL    Comment: Glucose reference range applies only to samples taken after fasting for at least 8 hours.  Glucose, capillary     Status: Abnormal   Collection Time: 01/29/20 11:19 PM  Result Value Ref Range   Glucose-Capillary 101 (H) 70 - 99 mg/dL    Comment: Glucose reference range applies only to samples taken after fasting for at least 8 hours.  CBC     Status: Abnormal   Collection Time:  01/30/20  6:57 AM  Result Value Ref Range   WBC 11.8 (H) 4.0 - 10.5 K/uL   RBC 4.66 3.87 - 5.11 MIL/uL   Hemoglobin 13.6 12.0 - 15.0 g/dL   HCT 39.7 67.3 - 41.9 %   MCV 88.2 80.0 - 100.0 fL   MCH 29.2 26.0 - 34.0 pg   MCHC 33.1 30.0 - 36.0 g/dL   RDW 37.9 02.4 - 09.7 %   Platelets 339 150 - 400 K/uL   nRBC 1.3 (H) 0.0 - 0.2 %    Comment: Performed at [facility], [address] [place], [place] [address]  Basic metabolic panel     Status: Abnormal   Collection Time: 01/30/20  6:57 AM  Result Value Ref Range   Sodium 141 135 - 145 mmol/L   Potassium 4.0 3.5 - 5.1 mmol/L   Chloride 114 (H) 98 - 111 mmol/L   CO2 15 (L) 22 - 32 mmol/L   Glucose, Bld 101 (H) 70 - 99 mg/dL    Comment: Glucose reference range applies only to samples taken after fasting for at least 8 hours.   BUN 11 6 - 20 mg/dL   Creatinine, Ser 9.24 0.44 - 1.00 mg/dL   Calcium 8.2 (L) 8.9 - 10.3 mg/dL   GFR, Estimated >26 >83 mL/min    Comment: (NOTE) Calculated using the CKD-EPI Creatinine Equation (2021)    Anion gap 12 5 - 15    Comment: Performed at [facility], [address] [place], [place] [address]  Hepatic function panel     Status: Abnormal   Collection Time: 01/30/20  6:57 AM  Result Value Ref Range   Total Protein 5.7 (L) 6.5 - 8.1 g/dL   Albumin 2.1 (L) 3.5 - 5.0 g/dL   AST 229 (H) 15 - 41 U/L   ALT 346 (H) 0 - 44 U/L   Alkaline Phosphatase 158 (H) 38 - 126 U/L   Total Bilirubin 2.1 (H) 0.3 - 1.2 mg/dL   Bilirubin, Direct 1.2 (H) 0.0 - 0.2 mg/dL   Indirect Bilirubin 0.9 0.3 - 0.9 mg/dL    Comment: Performed at [facility], [address] [place], [place] [address]  Glucose, capillary     Status: None   Collection Time: 01/30/20  8:40 AM  Result Value Ref Range   Glucose-Capillary 88 70 - 99 mg/dL    Comment: Glucose reference range applies only to samples taken after fasting for at least 8 hours.  Glucose, capillary     Status: Abnormal   Collection Time:  01/30/20  3:53 PM  Result Value Ref Range   Glucose-Capillary 102 (H) 70 - 99 mg/dL    Comment: Glucose reference range applies only to samples taken after fasting for at least 8 hours.  Glucose, capillary     Status: Abnormal   Collection Time: 01/30/20  7:51 PM  Result Value Ref Range   Glucose-Capillary 121 (H) 70 - 99 mg/dL    Comment: Glucose reference range applies only to samples taken after fasting for at least 8 hours.  CBC     Status: Abnormal   Collection Time: 01/31/20  3:23 AM  Result Value Ref Range   WBC 8.4 4.0 -  10.5 K/uL   RBC 4.03 3.87 - 5.11 MIL/uL   Hemoglobin 12.0 12.0 - 15.0 g/dL   HCT 58.5 (L) 27.7 - 82.4 %   MCV 86.8 80.0 - 100.0 fL   MCH 29.8 26.0 - 34.0 pg   MCHC 34.3 30.0 - 36.0 g/dL   RDW 23.5 36.1 - 44.3 %   Platelets 285 150 - 400 K/uL   nRBC 1.9 (H) 0.0 - 0.2 %    Comment: Performed at [facility], [address], [place], [place] [address]  Basic metabolic panel     Status: Abnormal   Collection Time: 01/31/20  3:23 AM  Result Value Ref Range   Sodium 139 135 - 145 mmol/L   Potassium 3.8 3.5 - 5.1 mmol/L   Chloride 115 (H) 98 - 111 mmol/L   CO2 17 (L) 22 - 32 mmol/L   Glucose, Bld 99 70 - 99 mg/dL    Comment: Glucose reference range applies only to samples taken after fasting for at least 8 hours.   BUN 12 6 - 20 mg/dL   Creatinine, Ser 8.67 0.44 - 1.00 mg/dL   Calcium 7.8 (L) 8.9 - 10.3 mg/dL   GFR, Estimated >61 >95 mL/min    Comment: (NOTE) Calculated using the CKD-EPI Creatinine Equation (2021)    Anion gap 7 5 - 15    Comment: Performed at [facility], [address] [place], [place] [address]  Hepatic function panel     Status: Abnormal   Collection Time: 01/31/20  3:23 AM  Result Value Ref Range   Total Protein 4.5 (L) 6.5 - 8.1 g/dL   Albumin 1.6 (L) 3.5 - 5.0 g/dL   AST 712 (H) 15 - 41 U/L   ALT 227 (H) 0 - 44 U/L   Alkaline Phosphatase 126 38 - 126 U/L   Total Bilirubin 1.4 (H) 0.3 - 1.2 mg/dL    Bilirubin, Direct 0.8 (H) 0.0 - 0.2 mg/dL   Indirect Bilirubin 0.6 0.3 - 0.9 mg/dL    Comment: Performed at [facility], [address] [place], [place] [address]  Glucose, capillary     Status: None   Collection Time: 01/31/20  5:39 AM  Result Value Ref Range   Glucose-Capillary 95 70 - 99 mg/dL    Comment: Glucose reference range applies only to samples taken after fasting for at least 8 hours.  Glucose, capillary     Status: None   Collection Time: 01/31/20  9:33 AM  Result Value Ref Range   Glucose-Capillary 87 70 - 99 mg/dL    Comment: Glucose reference range applies only to samples taken after fasting for at least 8 hours.  Glucose, capillary     Status: None   Collection Time: 01/31/20 12:23 PM  Result Value Ref Range   Glucose-Capillary 84 70 - 99 mg/dL    Comment: Glucose reference range applies only to samples taken after fasting for at least 8 hours.    Assessment:   35 y.o. G1P0000 [redacted]w[redacted]d with covid pneumonia, hospital day #5  Plan:   1) Covid pneumonia- continuing with HFNC, wean per respiratory. Continue with IV dexamethasone, remdesivir, vitamin C, and zinc. Insentive spirometry has been encouraged. Transaminitis has been improving.  2) Tolerating regular diet. Will add Ensure.  3) Will transition fetal monitoring to NST q shift as fetal heart tones have been overall reassuring.  4) DVT prophylaxis- lovenox daily, SCDs in place 5) Bedpan and bedside commode in room.  6) Disposition -  continued supportive care  Adelene Idler MD [facility], [facility] 01/31/2020 2:54 PM

## 2020-01-31 NOTE — Progress Notes (Signed)
From 9407-680,8-8 min deceleration down to 60 bpm noted, RN's at the bedside. Pt requested to use the restroom when deceleration occurred. RN suggest offering a bedpan for all bathroom needs. Pt sats went down to 90%, after being 95%-98%, since RT placed pt on heated high flow.

## 2020-01-31 NOTE — Progress Notes (Signed)
RN attempted to turn patient to prone position-O2 Sat decreased to 87%. Pt. Turned to left side-still not able to maintain 02 above 94% after several minutes. Pt discussed that she felt uncomfortable but not SOB. Pt repositioned to semi-fowler's per her request. O2 sat increased to 95%. Pt placed on bedpan and allowed to self-adjust in bed on HFNC. O2 decreased to 87%-91% on bedpan-RN at bedside. FHT 157. RT and MD notified of oxygen needs and pt status.

## 2020-01-31 NOTE — Progress Notes (Signed)
Pt. fefused lunch-no 2 hr pp CBG taken. Pt will order dinner. CBGs have maintained in the 80s.

## 2020-02-01 ENCOUNTER — Inpatient Hospital Stay (HOSPITAL_COMMUNITY)
Admission: AD | Admit: 2020-02-01 | Discharge: 2020-02-05 | DRG: 805 | Disposition: A | Discharge status: Home / Self Care | Payer: Medicaid Other | Source: Other Acute Inpatient Hospital | Attending: Obstetrics and Gynecology | Admitting: Obstetrics and Gynecology

## 2020-02-01 DIAGNOSIS — J9601 Acute respiratory failure with hypoxia: Secondary | ICD-10-CM | POA: Diagnosis not present

## 2020-02-01 DIAGNOSIS — O26613 Liver and biliary tract disorders in pregnancy, third trimester: Secondary | ICD-10-CM | POA: Diagnosis not present

## 2020-02-01 DIAGNOSIS — U071 COVID-19: Secondary | ICD-10-CM | POA: Diagnosis present

## 2020-02-01 DIAGNOSIS — Z982 Presence of cerebrospinal fluid drainage device: Secondary | ICD-10-CM | POA: Diagnosis not present

## 2020-02-01 DIAGNOSIS — O2662 Liver and biliary tract disorders in childbirth: Secondary | ICD-10-CM | POA: Diagnosis present

## 2020-02-01 DIAGNOSIS — O368330 Maternal care for abnormalities of the fetal heart rate or rhythm, third trimester, not applicable or unspecified: Secondary | ICD-10-CM | POA: Diagnosis not present

## 2020-02-01 DIAGNOSIS — O99213 Obesity complicating pregnancy, third trimester: Secondary | ICD-10-CM | POA: Diagnosis present

## 2020-02-01 DIAGNOSIS — G919 Hydrocephalus, unspecified: Secondary | ICD-10-CM

## 2020-02-01 DIAGNOSIS — O99214 Obesity complicating childbirth: Secondary | ICD-10-CM | POA: Diagnosis present

## 2020-02-01 DIAGNOSIS — O98513 Other viral diseases complicating pregnancy, third trimester: Secondary | ICD-10-CM | POA: Diagnosis not present

## 2020-02-01 DIAGNOSIS — R0600 Dyspnea, unspecified: Secondary | ICD-10-CM | POA: Diagnosis not present

## 2020-02-01 DIAGNOSIS — O9852 Other viral diseases complicating childbirth: Secondary | ICD-10-CM | POA: Diagnosis present

## 2020-02-01 DIAGNOSIS — Z7901 Long term (current) use of anticoagulants: Secondary | ICD-10-CM

## 2020-02-01 DIAGNOSIS — R0602 Shortness of breath: Secondary | ICD-10-CM

## 2020-02-01 DIAGNOSIS — R0902 Hypoxemia: Secondary | ICD-10-CM | POA: Diagnosis not present

## 2020-02-01 DIAGNOSIS — K8020 Calculus of gallbladder without cholecystitis without obstruction: Secondary | ICD-10-CM | POA: Diagnosis present

## 2020-02-01 DIAGNOSIS — Z3A36 36 weeks gestation of pregnancy: Secondary | ICD-10-CM

## 2020-02-01 DIAGNOSIS — E669 Obesity, unspecified: Secondary | ICD-10-CM | POA: Diagnosis present

## 2020-02-01 DIAGNOSIS — K8080 Other cholelithiasis without obstruction: Secondary | ICD-10-CM

## 2020-02-01 DIAGNOSIS — R7401 Elevation of levels of liver transaminase levels: Secondary | ICD-10-CM

## 2020-02-01 DIAGNOSIS — O9952 Diseases of the respiratory system complicating childbirth: Secondary | ICD-10-CM | POA: Diagnosis present

## 2020-02-01 DIAGNOSIS — O9853 Other viral diseases complicating the puerperium: Secondary | ICD-10-CM | POA: Diagnosis not present

## 2020-02-01 DIAGNOSIS — O99353 Diseases of the nervous system complicating pregnancy, third trimester: Secondary | ICD-10-CM | POA: Diagnosis not present

## 2020-02-01 DIAGNOSIS — Z3490 Encounter for supervision of normal pregnancy, unspecified, unspecified trimester: Secondary | ICD-10-CM | POA: Diagnosis not present

## 2020-02-01 DIAGNOSIS — O289 Unspecified abnormal findings on antenatal screening of mother: Secondary | ICD-10-CM | POA: Diagnosis not present

## 2020-02-01 DIAGNOSIS — O99215 Obesity complicating the puerperium: Secondary | ICD-10-CM | POA: Diagnosis not present

## 2020-02-01 DIAGNOSIS — Z8669 Personal history of other diseases of the nervous system and sense organs: Secondary | ICD-10-CM | POA: Diagnosis not present

## 2020-02-01 DIAGNOSIS — J1282 Pneumonia due to coronavirus disease 2019: Secondary | ICD-10-CM | POA: Diagnosis present

## 2020-02-01 DIAGNOSIS — Z3A37 37 weeks gestation of pregnancy: Secondary | ICD-10-CM | POA: Diagnosis not present

## 2020-02-01 DIAGNOSIS — Z3480 Encounter for supervision of other normal pregnancy, unspecified trimester: Secondary | ICD-10-CM

## 2020-02-01 LAB — GLUCOSE, CAPILLARY
Glucose-Capillary: 104 mg/dL — ABNORMAL HIGH (ref 70–99)
Glucose-Capillary: 93 mg/dL (ref 70–99)
Glucose-Capillary: 124 mg/dL — ABNORMAL HIGH (ref 70–99)

## 2020-02-01 LAB — CULTURE, BLOOD (ROUTINE X 2)
Culture: NO GROWTH
Culture: NO GROWTH

## 2020-02-01 LAB — COMPREHENSIVE METABOLIC PANEL
ALT: 157 U/L — ABNORMAL HIGH (ref 0–44)
AST: 76 U/L — ABNORMAL HIGH (ref 15–41)
Albumin: 1.9 g/dL — ABNORMAL LOW (ref 3.5–5.0)
Alkaline Phosphatase: 148 U/L — ABNORMAL HIGH (ref 38–126)
Anion gap: 7 (ref 5–15)
BUN: 14 mg/dL (ref 6–20)
CO2: 16 mmol/L — ABNORMAL LOW (ref 22–32)
Calcium: 7.8 mg/dL — ABNORMAL LOW (ref 8.9–10.3)
Chloride: 115 mmol/L — ABNORMAL HIGH (ref 98–111)
Creatinine, Ser: 0.77 mg/dL (ref 0.44–1.00)
GFR, Estimated: >60 mL/min (ref >60)
Glucose, Bld: 93 mg/dL (ref 70–99)
Potassium: 3.6 mmol/L (ref 3.5–5.1)
Sodium: 138 mmol/L (ref 135–145)
Total Bilirubin: 1.5 mg/dL — ABNORMAL HIGH (ref 0.3–1.2)
Total Protein: 5.1 g/dL — ABNORMAL LOW (ref 6.5–8.1)

## 2020-02-01 LAB — CBC
HCT: 37.6 % (ref 36.0–46.0)
HCT: 37.1 % (ref 36.0–46.0)
Hemoglobin: 13 g/dL (ref 12.0–15.0)
Hemoglobin: 13 g/dL (ref 12.0–15.0)
MCH: 29.7 pg (ref 26.0–34.0)
MCH: 30 pg (ref 26.0–34.0)
MCHC: 34.6 g/dL (ref 30.0–36.0)
MCHC: 35 g/dL (ref 30.0–36.0)
MCV: 85.8 fL (ref 80.0–100.0)
MCV: 85.5 fL (ref 80.0–100.0)
Platelets: 330 10*3/uL (ref 150–400)
Platelets: 324 10*3/uL (ref 150–400)
RBC: 4.38 MIL/uL (ref 3.87–5.11)
RBC: 4.34 MIL/uL (ref 3.87–5.11)
RDW: 13.1 % (ref 11.5–15.5)
RDW: 13.2 % (ref 11.5–15.5)
WBC: 10.1 10*3/uL (ref 4.0–10.5)
WBC: 10.2 10*3/uL (ref 4.0–10.5)
nRBC: 2.8 % — ABNORMAL HIGH (ref 0.0–0.2)
nRBC: 2.6 % — ABNORMAL HIGH (ref 0.0–0.2)

## 2020-02-01 LAB — D-DIMER, QUANTITATIVE: D-Dimer, Quant: 5.01 ug/mL-FEU — ABNORMAL HIGH (ref 0.00–0.50)

## 2020-02-01 LAB — CREATININE, SERUM
Creatinine, Ser: 0.88 mg/dL (ref 0.44–1.00)
GFR, Estimated: >60 mL/min (ref >60)

## 2020-02-01 LAB — C-REACTIVE PROTEIN: CRP: 1.4 mg/dL — ABNORMAL HIGH (ref <1.0)

## 2020-02-01 MED ORDER — SODIUM CHLORIDE 0.9% FLUSH: 10.0000 mL | INTRAVENOUS | Status: DC | PRN

## 2020-02-01 MED ORDER — DEXAMETHASONE SODIUM PHOSPHATE 10 MG/ML IJ SOLN
6.0000 mg | INTRAMUSCULAR | Status: DC
Start: 2020-02-01 — End: 2020-02-05
  Administered 2020-02-01 – 2020-02-04 (×3): 6 mg via INTRAVENOUS
  Filled 2020-02-01 (×3): qty 1
  Filled 2020-02-01: qty 0.6

## 2020-02-01 MED ORDER — ACETAMINOPHEN-CODEINE #3 300-30 MG PO TABS
1.0000 | ORAL_TABLET | Freq: Once | ORAL | Status: AC
  Administered 2020-02-01: 1 via ORAL
  Filled 2020-02-01: qty 1

## 2020-02-01 MED ORDER — ENOXAPARIN SODIUM 40 MG/0.4ML ~~LOC~~ SOLN
40.0000 mg | SUBCUTANEOUS | Status: DC
  Administered 2020-02-02: 40 mg via SUBCUTANEOUS
  Filled 2020-02-01: qty 0.4

## 2020-02-01 MED ORDER — SODIUM CHLORIDE 0.9% FLUSH
10.0000 mL | Freq: Two times a day (BID) | INTRAVENOUS | Status: DC
  Administered 2020-02-01 – 2020-02-04 (×5): 10 mL

## 2020-02-01 NOTE — Progress Notes (Signed)
Patient had several different family members calling the hospital and [facility] office to get updates on her care. Nurse advised patient that we cannot update family members without her permission and offered to have a password on her account to allow 2 family members to get updates from health care staff. Patient denied wanting to place a password on the account and states that she will update her family members. Patient called significant other while nurse was in the room to clarify any concerns or questions, patient significant other had none for the nurse. Patient will continue to update family on POC.

## 2020-02-01 NOTE — Progress Notes (Signed)
eLink Physician-Brief Progress Note Patient Name: MARIEN MANSHIP DOB: 01/16/1986 MRN: [id number]   Date of Service  02/01/2020  HPI/Events of Note  IV team unable to place PIV d/t edema. Request for Midline catheter order.   eICU Interventions  Plan: 1. IV Team may place midline catheter.      Intervention Category Major Interventions: Other:  Lenell Antu 02/01/2020, 9:32 PM

## 2020-02-01 NOTE — Progress Notes (Signed)
Subjective:  She reports she is feeling somewhat better. She had back pain overnight which improved with tylenol #3.  She reports normal fetal movement. She denies leakage of fluid. She denies vaginal bleeding. She denies painful contractions.   Objective:   Blood pressure 110/65, pulse 67, temperature 97.8 F (36.6 C), temperature source Axillary, resp. rate (!) 26, height 5\' 4"  (1.626 m), weight 86.6 kg, last menstrual period 05/18/2019, SpO2 96 %.  General: NAD Pulmonary: no increased work of breathing Abdomen: non-distended, non-tender Uterus:  gravid Extremities: no edema, no erythema, no tenderness, no signs of DVT  Results for orders placed or performed during the hospital encounter of 01/27/20 (from the past 72 hour(s))  Glucose, capillary     Status: None   Collection Time: 01/29/20  3:09 PM  Result Value Ref Range   Glucose-Capillary 90 70 - 99 mg/dL    Comment: Glucose reference range applies only to samples taken after fasting for at least 8 hours.  Glucose, capillary     Status: None   Collection Time: 01/29/20  7:30 PM  Result Value Ref Range   Glucose-Capillary 93 70 - 99 mg/dL    Comment: Glucose reference range applies only to samples taken after fasting for at least 8 hours.  Glucose, capillary     Status: Abnormal   Collection Time: 01/29/20 11:19 PM  Result Value Ref Range   Glucose-Capillary 101 (H) 70 - 99 mg/dL    Comment: Glucose reference range applies only to samples taken after fasting for at least 8 hours.  CBC     Status: Abnormal   Collection Time: 01/30/20  6:57 AM  Result Value Ref Range   WBC 11.8 (H) 4.0 - 10.5 K/uL   RBC 4.66 3.87 - 5.11 MIL/uL   Hemoglobin 13.6 12.0 - 15.0 g/dL   HCT 03/29/20 25.3 - 66.4 %   MCV 88.2 80.0 - 100.0 fL   MCH 29.2 26.0 - 34.0 pg   MCHC 33.1 30.0 - 36.0 g/dL   RDW 40.3 47.4 - 25.9 %   Platelets 339 150 - 400 K/uL   nRBC 1.3 (H) 0.0 - 0.2 %    Comment: Performed at [facility], [address]  [place], [place]  Basic metabolic panel     Status: Abnormal   Collection Time: 01/30/20  6:57 AM  Result Value Ref Range   Sodium 141 135 - 145 mmol/L   Potassium 4.0 3.5 - 5.1 mmol/L   Chloride 114 (H) 98 - 111 mmol/L   CO2 15 (L) 22 - 32 mmol/L   Glucose, Bld 101 (H) 70 - 99 mg/dL    Comment: Glucose reference range applies only to samples taken after fasting for at least 8 hours.   BUN 11 6 - 20 mg/dL   Creatinine, Ser 03/29/20 0.44 - 1.00 mg/dL   Calcium 8.2 (L) 8.9 - 10.3 mg/dL   GFR, Estimated 3.32 >95 mL/min    Comment: (NOTE) Calculated using the CKD-EPI Creatinine Equation (2021)    Anion gap 12 5 - 15    Comment: Performed at [facility], [address] [place], [place]  Hepatic function panel     Status: Abnormal   Collection Time: 01/30/20  6:57 AM  Result Value Ref Range   Total Protein 5.7 (L) 6.5 - 8.1 g/dL   Albumin 2.1 (L) 3.5 - 5.0 g/dL   AST 03/29/20 (H) 15 - 41 U/L   ALT 346 (H) 0 -  44 U/L   Alkaline Phosphatase 158 (H) 38 - 126 U/L   Total Bilirubin 2.1 (H) 0.3 - 1.2 mg/dL   Bilirubin, Direct 1.2 (H) 0.0 - 0.2 mg/dL   Indirect Bilirubin 0.9 0.3 - 0.9 mg/dL    Comment: Performed at [facility], [address] [place], [place] [address]  Glucose, capillary     Status: None   Collection Time: 01/30/20  8:40 AM  Result Value Ref Range   Glucose-Capillary 88 70 - 99 mg/dL    Comment: Glucose reference range applies only to samples taken after fasting for at least 8 hours.  Glucose, capillary     Status: Abnormal   Collection Time: 01/30/20  3:53 PM  Result Value Ref Range   Glucose-Capillary 102 (H) 70 - 99 mg/dL    Comment: Glucose reference range applies only to samples taken after fasting for at least 8 hours.  Glucose, capillary     Status: Abnormal   Collection Time: 01/30/20  7:51 PM  Result Value Ref Range   Glucose-Capillary 121 (H) 70 - 99 mg/dL    Comment: Glucose reference range applies only to samples taken after  fasting for at least 8 hours.  CBC     Status: Abnormal   Collection Time: 01/31/20  3:23 AM  Result Value Ref Range   WBC 8.4 4.0 - 10.5 K/uL   RBC 4.03 3.87 - 5.11 MIL/uL   Hemoglobin 12.0 12.0 - 15.0 g/dL   HCT 35.0 (L) 36.0 - 46.0 %   MCV 86.8 80.0 - 100.0 fL   MCH 29.8 26.0 - 34.0 pg   MCHC 34.3 30.0 - 36.0 g/dL   RDW 13.1 11.5 - 15.5 %   Platelets 285 150 - 400 K/uL   nRBC 1.9 (H) 0.0 - 0.2 %    Comment: Performed at [facility], [address] [place], [place] [address]  Basic metabolic panel     Status: Abnormal   Collection Time: 01/31/20  3:23 AM  Result Value Ref Range   Sodium 139 135 - 145 mmol/L   Potassium 3.8 3.5 - 5.1 mmol/L   Chloride 115 (H) 98 - 111 mmol/L   CO2 17 (L) 22 - 32 mmol/L   Glucose, Bld 99 70 - 99 mg/dL    Comment: Glucose reference range applies only to samples taken after fasting for at least 8 hours.   BUN 12 6 - 20 mg/dL   Creatinine, Ser 0.65 0.44 - 1.00 mg/dL   Calcium 7.8 (L) 8.9 - 10.3 mg/dL   GFR, Estimated >60 >60 mL/min    Comment: (NOTE) Calculated using the CKD-EPI Creatinine Equation (2021)    Anion gap 7 5 - 15    Comment: Performed at [facility], [address] [place], [place] [address]  Hepatic function panel     Status: Abnormal   Collection Time: 01/31/20  3:23 AM  Result Value Ref Range   Total Protein 4.5 (L) 6.5 - 8.1 g/dL   Albumin 1.6 (L) 3.5 - 5.0 g/dL   AST 208 (H) 15 - 41 U/L   ALT 227 (H) 0 - 44 U/L   Alkaline Phosphatase 126 38 - 126 U/L   Total Bilirubin 1.4 (H) 0.3 - 1.2 mg/dL   Bilirubin, Direct 0.8 (H) 0.0 - 0.2 mg/dL   Indirect Bilirubin 0.6 0.3 - 0.9 mg/dL    Comment: Performed at [facility], [address], [place], [place] [address]  Glucose, capillary  Status: None   Collection Time: 01/31/20  5:39 AM  Result Value Ref Range   Glucose-Capillary 95 70 - 99 mg/dL    Comment: Glucose reference range applies only to samples taken after fasting for at least 8  hours.  Glucose, capillary     Status: None   Collection Time: 01/31/20  9:33 AM  Result Value Ref Range   Glucose-Capillary 87 70 - 99 mg/dL    Comment: Glucose reference range applies only to samples taken after fasting for at least 8 hours.  Glucose, capillary     Status: None   Collection Time: 01/31/20 12:23 PM  Result Value Ref Range   Glucose-Capillary 84 70 - 99 mg/dL    Comment: Glucose reference range applies only to samples taken after fasting for at least 8 hours.  Glucose, capillary     Status: None   Collection Time: 01/31/20  6:36 PM  Result Value Ref Range   Glucose-Capillary 92 70 - 99 mg/dL    Comment: Glucose reference range applies only to samples taken after fasting for at least 8 hours.  CBC     Status: Abnormal   Collection Time: 02/01/20  6:17 AM  Result Value Ref Range   WBC 10.1 4.0 - 10.5 K/uL   RBC 4.38 3.87 - 5.11 MIL/uL   Hemoglobin 13.0 12.0 - 15.0 g/dL   HCT 38.7 56.4 - 33.2 %   MCV 85.8 80.0 - 100.0 fL   MCH 29.7 26.0 - 34.0 pg   MCHC 34.6 30.0 - 36.0 g/dL   RDW 95.1 88.4 - 16.6 %   Platelets 330 150 - 400 K/uL   nRBC 2.8 (H) 0.0 - 0.2 %    Comment: Performed at [facility], [address] [place], [place] [address]  Comprehensive metabolic panel     Status: Abnormal   Collection Time: 02/01/20  6:17 AM  Result Value Ref Range   Sodium 138 135 - 145 mmol/L   Potassium 3.6 3.5 - 5.1 mmol/L   Chloride 115 (H) 98 - 111 mmol/L   CO2 16 (L) 22 - 32 mmol/L   Glucose, Bld 93 70 - 99 mg/dL    Comment: Glucose reference range applies only to samples taken after fasting for at least 8 hours.   BUN 14 6 - 20 mg/dL   Creatinine, Ser 6.01 0.44 - 1.00 mg/dL   Calcium 7.8 (L) 8.9 - 10.3 mg/dL   Total Protein 5.1 (L) 6.5 - 8.1 g/dL   Albumin 1.9 (L) 3.5 - 5.0 g/dL   AST 76 (H) 15 - 41 U/L   ALT 157 (H) 0 - 44 U/L   Alkaline Phosphatase 148 (H) 38 - 126 U/L   Total Bilirubin 1.5 (H) 0.3 - 1.2 mg/dL   GFR, Estimated >09 >32 mL/min     Comment: (NOTE) Calculated using the CKD-EPI Creatinine Equation (2021)    Anion gap 7 5 - 15    Comment: Performed at [facility], [address] [place], [place] [address]    Assessment:   35 y.o. G1P0000 [redacted]w[redacted]d hospital day # 6  Plan:   1) Covid pneumonia- continuing with HFNC, wean per respiratory. Continue with IV dexamethasone, remdesivir, vitamin C, and zinc. Insentive spirometry has been encouraged. Transaminitis has been improving.  2) Tolerating regular diet. Will add Ensure.  3) NST q shift as fetal heart tones have been overall reassuring.  4) DVT prophylaxis- lovenox daily, SCDs in place 5) Bedpan and bedside commode  in room.  6) Disposition - continued supportive care 7) Dicussed delivery in the context of lack of improvement with Covid symptoms.  Adelene Idler MD [facility], [facility] 02/01/2020 9:16 AM

## 2020-02-01 NOTE — Progress Notes (Signed)
Called to pt. Bedside to evaluate fetal heart tones. Doppler performed with FHR 130-140bpm. Pt. Endorses fetal movement. Pt. Denies any LOF or vaginal bleeding at this time. Dr. Despina Hidden attending OBGYN notified of patient's arrival and heart tones. Verbal order taken for NST BID starting tomorrow since she had one at 1800 tonight. Non-pitting edema noted to bilateral lower extremities that patient states started today.   ICU RN can contact OB rapid response RN if any concerns [phone]

## 2020-02-01 NOTE — Discharge Summary (Addendum)
DC Summary Discharge Summary   Patient ID: Vicki Mcdowell [id number] 35 y.o. 1985/11/22  Admit date: 01/27/2020  Discharge date: 02/01/2020  Principal Diagnoses: COVID-10 affecting pregnancy in third trimester Pneumonia due to COVID-19 virus Obesity in pregnancy, antepartum, third trimester Obesity, Class I, BMI 30-34.9 Transaminitis   Procedures performed during the hospitalization:  EXAM: PORTABLE CHEST 1 VIEW -COMPARISON:  02/02/2012 FINDINGS: Right-sided shunt catheter. Mild patchy foci of airspace disease bilaterally. Normal heart size. No pneumothorax. IMPRESSION: Mild patchy foci of airspace disease bilaterally, suspect for multifocal pneumonia.  EXAM: ULTRASOUND ABDOMEN LIMITED RIGHT UPPER QUADRANT COMPARISON:  None. FINDINGS: Gallbladder: Cholelithiasis with multiple small stones demonstrated in the gallbladder. No gallbladder wall thickening or edema. Murphy's sign is negative. Common bile duct: Diameter: 2.7 mm, normal Liver: No focal lesion identified. Within normal limits in parenchymal echogenicity. Portal vein is patent on color Doppler imaging with normal direction of blood flow towards the liver. Other: None. IMPRESSION: Cholelithiasis. No evidence of cholecystitis.   EXAM: CHEST - 2 VIEW COMPARISON:  Portable chest 01/27/2020 and earlier. FINDINGS: Seated AP and lateral views of the chest. Chronic right neck, chest and abdomen shunt catheter. Lung volumes and mediastinal contours have not significantly changed since 2014. No cardiomegaly. Visualized tracheal air column is within normal limits. Patchy and indistinct bilateral mid and lower lung opacity. No pneumothorax or pleural effusion. Compared to 01/27/2020, ventilation is stable. No acute osseous abnormality identified. Visible bowel-gas pattern within normal limits. IMPRESSION: Stable bilateral COVID-19 pneumonia since 01/27/2020.  EXAM: BILATERAL LOWER EXTREMITY VENOUS DOPPLER  ULTRASOUND TECHNIQUE: Gray-scale sonography with graded compression, as well as color Doppler and duplex ultrasound were performed to evaluate the lower extremity deep venous systems from the level of the common femoral vein and including the common femoral, femoral, profunda femoral, popliteal and calf veins including the posterior tibial, peroneal and gastrocnemius veins when visible. The superficial great saphenous vein was also interrogated. Spectral Doppler was utilized to evaluate flow at rest and with distal augmentation maneuvers in the common femoral, femoral and popliteal veins. COMPARISON:  None. FINDINGS: RIGHT LOWER EXTREMITY Common Femoral Vein: No evidence of thrombus. Normal compressibility, respiratory phasicity and response to augmentation. Saphenofemoral Junction: No evidence of thrombus. Normal compressibility and flow on color Doppler imaging. Profunda Femoral Vein: No evidence of thrombus. Normal compressibility and flow on color Doppler imaging. Femoral Vein: No evidence of thrombus. Normal compressibility, respiratory phasicity and response to augmentation. Popliteal Vein: No evidence of thrombus. Normal compressibility, respiratory phasicity and response to augmentation. Calf Veins: No evidence of thrombus. Normal compressibility and flow on color Doppler imaging. Superficial Great Saphenous Vein: No evidence of thrombus. Normal compressibility. Venous Reflux:  None. Other Findings: No evidence of superficial thrombophlebitis or abnormal fluid collection. LEFT LOWER EXTREMITY Common Femoral Vein: No evidence of thrombus. Normal compressibility, respiratory phasicity and response to augmentation. Saphenofemoral Junction: No evidence of thrombus. Normal compressibility and flow on color Doppler imaging. Profunda Femoral Vein: No evidence of thrombus. Normal compressibility and flow on color Doppler imaging. Femoral Vein: No evidence of thrombus. Normal  compressibility, respiratory phasicity and response to augmentation. Popliteal Vein: No evidence of thrombus. Normal compressibility, respiratory phasicity and response to augmentation. Calf Veins: No evidence of thrombus. Normal compressibility and flow on color Doppler imaging. Superficial Great Saphenous Vein: No evidence of thrombus. Normal compressibility. Venous Reflux:  None. Other Findings: No evidence of superficial thrombophlebitis or abnormal fluid collection. IMPRESSION: No evidence of deep venous thrombosis in either lower extremity.  HPI:  Vicki Mcdowell is a 35 y.o G1P0000 female at 37w dated by LMP c/w 7w [place]. Prior to admission her pregnancy was complicated by mild obesity, history of hydrocephalus with VP shunt. She presented to [facility] ED on 01/27/20 following 2 days of SOB that continued to worsen. She reported she and her entire household had tested positive for COVID-19. She denied additional S&S including fever, chills, GI sx, visual changes, or RUQ pain.  Past Medical History:  Diagnosis Date  . VP (ventriculoperitoneal) shunt status     Past Surgical History:  Procedure Laterality Date  . DG HYSTEROGRAM (HSG)  10/28/2018      . hydroceph    . SHUNT REVISION VENTRICULAR-PERITONEAL      No Known Allergies  Social History   Tobacco Use  . Smoking status: Never Smoker  . Smokeless tobacco: Never Used  Vaping Use  . Vaping Use: Never used  Substance Use Topics  . Alcohol use: Never  . Drug use: No    History reviewed. No pertinent family history.  Hospital Course: Patient was admitted and diagnosed with COVID-19 pneumonia with moderate disease presentation. Patient was started on remdesivir on 01/28/20 for a four day course. Patient was also started on dexamethasone IV. On hospital day 1 patient began requiring O2 supplementation via [place]. Patient with elevated d-dimer, unable to complete CTA d/t pregnancy, [place] of b/l LE neg for DVTs. LFTs were trended d/t  transaminitis on admission, treaded downward throughout admission. Hyperbilrubiemia: [place] abd noted cholelithiasis with no evidence of cholecystitis. Fetal status reassuring throughout admission. NSTs reactive. Fetal heart rate decelerations noted with patient O2 desaturation but stabilized with O2 supplementation. On hospital day 5 patient continued to require an increase in O2 supplementation with transition to HFNC. Patient noted to have decrease in spO2 with minimal movement out of bed, requiring addition of 15L NRB to maintain sats with HFNC present. Given increase in O2 requirements transfer to higher level of care was pursued. Plan reviewed with Dr. Jean Rosenthal. Dr. Debroah Loop to except patient for transfer to [facility].  Discharge Exam: BP 122/77 (BP Location: Right Arm)   Pulse 85   Temp (!) 97.5 F (36.4 C) (Oral)   Resp (!) 32   Ht 5\' 4"  (1.626 m)   Wt 86.6 kg   LMP 05/18/2019   SpO2 96%   BMI 32.79 kg/m  OBGyn Exam   Condition at Discharge: Stable  Complications affecting treatment: None  Discharge Medications:  Allergies as of 02/01/2020   No Known Allergies     Medication List    TAKE these medications   ondansetron 4 MG disintegrating tablet Commonly known as: Zofran ODT Take 1 tablet (4 mg total) by mouth every 6 (six) hours as needed for nausea.       Discharge Disposition: 03/31/2020 - Women's Discharge disposition: [facility] Not Defined   Greater than 30 minutes were spent face-to-face with the patient as well as preparation, review, communication, and documentation to coordinate this discharge and transfer of care.   SignedRedge Gainer, CNM  02/01/2020 5:30 PM

## 2020-02-01 NOTE — Consult Note (Signed)
PROGRESS NOTE    NOHEMI NICKLAUS  EQA:[id number] DOB: [birth year] DOA: 01/27/2020 PCP: Leonides Sake, MD    Brief Narrative:  GRACLYN LAWTHER is a 35 y.o. G1P0000 female at [redacted]w[redacted]d dated by LMP c/w with 7 week ultrasound.  Her pregnancy has been complicated by mild obesity, history of hydrocephalus with VP shunt. .  Admitted for SOB. Found with Covid 19.   02/01/19: pt with litle ambulation requiring 15L, and at rest HFNC 35%-40%   Antimicrobials:   Remdesivir   Subjective: Pt denies sob, but states when she is talking on the phone she is sob. No cp. Reports hydrating and eating fine. UO less per nsg.   Objective: Vitals:   02/01/20 1108 02/01/20 1109 02/01/20 1110 02/01/20 1218  BP:    121/74  Pulse: 72 72 65 74  Resp: (!) 27 (!) 27 (!) 24 (!) 23  Temp:    97.8 F (36.6 C)  TempSrc:    Oral  SpO2: 94% 95% 95% 96%  Weight:      Height:        Intake/Output Summary (Last 24 hours) at 02/01/2020 1321 Last data filed at 02/01/2020 1050 Gross per 24 hour  Intake 240 ml  Output 1000 ml  Net -760 ml   Filed Weights   01/27/20 2327  Weight: 86.6 kg    Examination:  General exam: Appears calm and comfortable , becomes mildly sob with conversation. Respiratory system: coarse bs,no wheezing Cardiovascular system: S1 & S2 heard, RRR. No JVD, murmurs, rubs, gallops or clicks.  Gastrointestinal system: Abdomen pregnant,soft and nontender. +bs Central nervous system: Alert and oriented. No focal neurological deficits. Extremities: no edema Psychiatry: Judgement and insight appear normal. In foul mood    Data Reviewed: I have personally reviewed following labs and imaging studies  CBC: Recent Labs  Lab 01/28/20 1523 01/29/20 0647 01/30/20 0657 01/31/20 0323 02/01/20 0617  WBC 6.6 8.6 11.8* 8.4 10.1  HGB 11.8* 12.5 13.6 12.0 13.0  HCT 34.4* 36.8 41.1 35.0* 37.6  MCV 86.0 87.0 88.2 86.8 85.8  PLT 188 227 339 285 979   Basic Metabolic Panel: Recent Labs  Lab  01/27/20 1931 01/28/20 0606 01/28/20 1523 01/29/20 0647 01/30/20 0657 01/31/20 0323 02/01/20 0617  NA  --  132*  --  138 141 139 138  K  --  3.6  --  3.6 4.0 3.8 3.6  CL  --  103  --  110 114* 115* 115*  CO2  --  18*  --  16* 15* 17* 16*  GLUCOSE  --  93  --  96 101* 99 93  BUN  --  8  --  8 11 12 14   CREATININE  --  0.73 0.71 0.81 0.87 0.65 0.77  CALCIUM  --  7.9*  --  8.1* 8.2* 7.8* 7.8*  MG 1.7  --   --  1.8  --   --   --    GFR: Estimated Creatinine Clearance: 104.6 mL/min (by C-G formula based on SCr of 0.77 mg/dL). Liver Function Tests: Recent Labs  Lab 01/28/20 0606 01/29/20 0647 01/30/20 0657 01/31/20 0323 02/01/20 0617  AST 256* 611* 465* 208* 76*  ALT 160* 376* 346* 227* 157*  ALKPHOS 134* 144* 158* 126 148*  BILITOT 2.0* 2.1* 2.1* 1.4* 1.5*  PROT 5.3* 5.3* 5.7* 4.5* 5.1*  ALBUMIN 2.1* 1.9* 2.1* 1.6* 1.9*   No results for input(s): LIPASE, AMYLASE in the last 168 hours. No results for input(s):  AMMONIA in the last 168 hours. Coagulation Profile: No results for input(s): INR, PROTIME in the last 168 hours. Cardiac Enzymes: No results for input(s): CKTOTAL, CKMB, CKMBINDEX, TROPONINI in the last 168 hours. BNP (last 3 results) No results for input(s): PROBNP in the last 8760 hours. HbA1C: No results for input(s): HGBA1C in the last 72 hours. CBG: Recent Labs  Lab 01/31/20 0539 01/31/20 0933 01/31/20 1223 01/31/20 1836 02/01/20 1214  GLUCAP 95 87 84 92 104*   Lipid Profile: No results for input(s): CHOL, HDL, LDLCALC, TRIG, CHOLHDL, LDLDIRECT in the last 72 hours. Thyroid Function Tests: No results for input(s): TSH, T4TOTAL, FREET4, T3FREE, THYROIDAB in the last 72 hours. Anemia Panel: No results for input(s): VITAMINB12, FOLATE, FERRITIN, TIBC, IRON, RETICCTPCT in the last 72 hours. Sepsis Labs: Recent Labs  Lab 01/27/20 1931 01/27/20 2250  PROCALCITON <0.10  --   LATICACIDVEN 1.9 1.5    Recent Results (from the past 240 hour(s))  Blood  culture (routine x 2)     Status: None   Collection Time: 01/27/20  7:31 PM   Specimen: BLOOD  Result Value Ref Range Status   Specimen Description BLOOD LUE  Final   Special Requests   Final    BOTTLES DRAWN AEROBIC AND ANAEROBIC Blood Culture results may not be optimal due to an inadequate volume of blood received in culture bottles   Culture   Final    NO GROWTH 5 DAYS Performed at [facility], [address] [place], [place] [address]    Report Status 02/01/2020 FINAL  Final  Blood culture (routine x 2)     Status: None   Collection Time: 01/27/20  7:31 PM   Specimen: BLOOD  Result Value Ref Range Status   Specimen Description BLOOD BLOOD LEFT ARM  Final   Special Requests   Final    BOTTLES DRAWN AEROBIC AND ANAEROBIC Blood Culture results may not be optimal due to an inadequate volume of blood received in culture bottles   Culture   Final    NO GROWTH 5 DAYS Performed at [facility], [address] [place], [place] [address]    Report Status 02/01/2020 FINAL  Final  OB RESULT CONSOLE Group B Strep     Status: None   Collection Time: 01/28/20 12:00 AM  Result Value Ref Range Status   GBS Negative  Final  Culture, beta strep (group b only)     Status: None   Collection Time: 01/28/20  6:22 PM   Specimen: Vaginal/Rectal; Genital  Result Value Ref Range Status   Specimen Description   Final    VAGINAL/RECTAL Performed at [facility], [address], [place], [place] [address]    Special Requests   Final    NONE Performed at [facility], [address] [place], [place] [address]    Culture   Final    NO GROUP B STREP (S.AGALACTIAE) ISOLATED Performed at [facility], [address] [place], [place] [address]    Report Status 01/30/2020 FINAL  Final         Radiology Studies: No results found.      Scheduled Meds: . vitamin C  250 mg Oral Daily  . dexamethasone (DECADRON) injection  6 mg Intravenous Q24H   . enoxaparin (LOVENOX) injection  40 mg Subcutaneous Q24H  . feeding supplement  237 mL Oral BID BM  . insulin aspart  0-15 Units Subcutaneous TID WC  . zinc sulfate  220 mg Oral Daily   Continuous Infusions: . sodium chloride 125 mL/hr at 02/01/20 5638    Assessment & Plan:   Principal Problem:   COVID-19 affecting pregnancy in third trimester Active Problems:   Supervision of other normal pregnancy, antepartum   Obesity in pregnancy, antepartum, third trimester   VP (ventriculoperitoneal) shunt status   Transaminitis   Pneumonia due to COVID-19 virus   Shortness of breath after COVID-19 vaccination   COVID-19 pneumonia: Slowly improving.  Still requiring high amounts of oxygen Continue IV remdesivir and  IV decadron as per OBGYN Dc ivf Keep 02 sat >92% IS Ck fibrin product Ck crp Spoke to PCCM Dr. Meredeth Ide to see pt.    Elevated d-dimer: unable to do CTA chest 2/2 pregnancy. Venous US negative. Continue Lovenox   Transaminitis:  Secondary to COVID-19 and remdesivir use Trending down Continue to monitor   Hyperbilirubinemia:  Improving, etiology unclear  Ultrasound abdomen revealed cholelithiasis and no evidence of cholecystitis     Hyponatremia: Resolved  Pregnancy: management per primary team/OBGYN  Obesity: BMI 32.7. Complicates overall care & prognosis     DVT prophylaxis: lovenox Code Status:full  Family Communication: none at bedside Disposition Plan: TBD Status is: Inpatient               LOS: 5 days   Time spent: 35 min with >50% on coc    Lynn Ito, MD [facility] Pager [phone]  If 7PM-7AM, please contact night-coverage 02/01/2020, 1:21 PM

## 2020-02-01 NOTE — Progress Notes (Signed)
I was contacted by the patient's OB provider regarding her possible need for anesthesia and known VP shunt.  She has not had any recent symptoms or intervention for the shunt.  She is clear from our perspective for anesthesia as needed.  There should be no increased risk for an epidural anesthetic or general anesthesia.  If a C-section is planned, would recommend being done where neurosurgery is available to intervene if needed for contamination of possible shunt catheter.

## 2020-02-01 NOTE — Progress Notes (Signed)
I spoke with MD Adriana Simas, on-call neurosurgeon. He cleared patient for epidural or spinal anesthesia with regards to the VP shunt. His thought regarding mode of delivery is: vaginal preferred due to possible risk of infection of shunt with cesarean delivery.   [name], CNM

## 2020-02-01 NOTE — Progress Notes (Signed)
Report given to Eddie Candle, RN at [facility] ICU 3. Report given to [facility] Paramedic with [facility]. [facility] transported patient off unit approximately 1905.

## 2020-02-01 NOTE — Consult Note (Incomplete)
NAME:  Vicki Mcdowell, MRN:  [id number], DOB:  1985/08/27, LOS: 5 ADMISSION DATE:  01/27/2020, CONSULTATION DATE:  02/01/20 REFERRING MD:  Glennon Mac CHIEF COMPLAINT:  Dyspnea   Brief History:  Vicki Mcdowell is a 35 y.o. F G1P0000 at [redacted]w[redacted]d by LMP, admitted to [facility] 12/31 with COVID PNA. Transferred to [facility] ICU 1/5 due to increased O2 requirements.  History of Present Illness:  Vicki Mcdowell is a 35 y.o. F G1P0000 at [redacted]w[redacted]d by LMP.  She was admitted to [facility] 12/31 with dyspnea x 2 days 2/2 COVID PNA.  Her whole household had been infected prior to her being symptomatic.  She was unvaccinated.  She was initially on room air; however, began to have new O2 requirement of 2L via [place] on 1/1.  She was started on Remdesivir and Decadron.  On 1/4, she had desaturation with proning and persisted after back supine.  O2 increased to [place].  By 1/5, she was up to 15L with any exertion.  Due to increasing O2 requirements, it was felt that she should be transferred to [facility] ICU for further management.  Past Medical History:  Hydrocephalus s/p VP shunt.  Significant Hospital Events:  12/31 > admit. 1/5 > transfer to [facility] ICU.  Consults:  PCCM.  Procedures:  None.  Significant Diagnostic Tests:  Abd [place] 12/31 > cholelithiasis, no cholecystitis. LE Duplex 1/2 > neg.  Micro Data:  None.  Antimicrobials:  None.   Interim History / Subjective:  ***  Objective   Blood pressure 116/68, pulse 77, temperature 97.7 F (36.5 C), temperature source Oral, resp. rate (!) 28, height 5\' 4"  (1.626 m), weight 86.6 kg, last menstrual period 05/18/2019, SpO2 96 %.    FiO2 (%):  [35 %] 35 %   Intake/Output Summary (Last 24 hours) at 02/01/2020 1534 Last data filed at 02/01/2020 1330 Gross per 24 hour  Intake 240 ml  Output 1100 ml  Net -860 ml   Filed Weights   01/27/20 2327  Weight: 86.6 kg    Examination: General: *** HENT: *** Lungs: *** Cardiovascular: *** Abdomen: *** Extremities: *** Neuro:  *** GU: ***   Assessment & Plan:   Acute hypoxic respiratory failure - 2/2 COVID PNA.  S/p course of Remdesivir, currently on day 5/10 Decadron. - Continue supplemental O2 as needed to maintain SpO2 > 94% per OBGYN.  Currently on 45% HHFNC. - Continue Decadron, Zinc, Vit C.  Term Pregnancy. - Per OBGYN.  Elevated D-Dimer - LE duplex neg.  CTA restricted due to pregnancy. - Continue Lovenox.  Transaminitis - 2/2 COVID + Remdesivir. Hyperbilirubinemia - improving, unclear etiology. Abd [place] with cholelithiasis and no evidence of cholecystitis. - Trend.  Hx hydrocephalus - s/p VP shunt placement. - No interventions required.  Best practice (evaluated daily)  Diet: Regular. Pain/Anxiety/Delirium protocol (if indicated): None. VAP protocol (if indicated): None. DVT prophylaxis: Lovenox / SCD's. GI prophylaxis: None. Glucose control: SSI if glucose consistently > 180. Mobility: Bedrest. Disposition:ICU.  Goals of Care:  Last date of multidisciplinary goals of care discussion:None. Family and staff present: None. Summary of discussion: None. Follow up goals of care discussion due: None. Code Status: Full.  Labs   CBC: Recent Labs  Lab 01/28/20 1523 01/29/20 0647 01/30/20 0657 01/31/20 0323 02/01/20 0617  WBC 6.6 8.6 11.8* 8.4 10.1  HGB 11.8* 12.5 13.6 12.0 13.0  HCT 34.4* 36.8 41.1 35.0* 37.6  MCV 86.0 87.0 88.2 86.8 85.8  PLT 188 227 339 285 330  Basic Metabolic Panel: Recent Labs  Lab 01/27/20 1931 01/28/20 0606 01/28/20 1523 01/29/20 0647 01/30/20 0657 01/31/20 0323 02/01/20 0617  NA  --  132*  --  138 141 139 138  K  --  3.6  --  3.6 4.0 3.8 3.6  CL  --  103  --  110 114* 115* 115*  CO2  --  18*  --  16* 15* 17* 16*  GLUCOSE  --  93  --  96 101* 99 93  BUN  --  8  --  8 11 12 14   CREATININE  --  0.73 0.71 0.81 0.87 0.65 0.77  CALCIUM  --  7.9*  --  8.1* 8.2* 7.8* 7.8*  MG 1.7  --   --  1.8  --   --   --    GFR: Estimated Creatinine Clearance:  104.6 mL/min (by C-G formula based on SCr of 0.77 mg/dL). Recent Labs  Lab 01/27/20 1931 01/27/20 2250 01/28/20 0606 01/29/20 0647 01/30/20 0657 01/31/20 0323 02/01/20 0617  PROCALCITON <0.10  --   --   --   --   --   --   WBC  --   --    < > 8.6 11.8* 8.4 10.1  LATICACIDVEN 1.9 1.5  --   --   --   --   --    < > = values in this interval not displayed.    Liver Function Tests: Recent Labs  Lab 01/28/20 0606 01/29/20 0647 01/30/20 0657 01/31/20 0323 02/01/20 0617  AST 256* 611* 465* 208* 76*  ALT 160* 376* 346* 227* 157*  ALKPHOS 134* 144* 158* 126 148*  BILITOT 2.0* 2.1* 2.1* 1.4* 1.5*  PROT 5.3* 5.3* 5.7* 4.5* 5.1*  ALBUMIN 2.1* 1.9* 2.1* 1.6* 1.9*   No results for input(s): LIPASE, AMYLASE in the last 168 hours. No results for input(s): AMMONIA in the last 168 hours.  ABG No results found for: PHART, PCO2ART, PO2ART, HCO3, TCO2, ACIDBASEDEF, O2SAT   Coagulation Profile: No results for input(s): INR, PROTIME in the last 168 hours.  Cardiac Enzymes: No results for input(s): CKTOTAL, CKMB, CKMBINDEX, TROPONINI in the last 168 hours.  HbA1C: Hgb A1c MFr Bld  Date/Time Value Ref Range Status  12/10/2018 02:23 PM 5.4 4.8 - 5.6 % Final    Comment:             Prediabetes: 5.7 - 6.4          Diabetes: >6.4          Glycemic control for adults with diabetes: <7.0     CBG: Recent Labs  Lab 01/31/20 0539 01/31/20 0933 01/31/20 1223 01/31/20 1836 02/01/20 1214  GLUCAP 95 87 84 92 104*    Review of Systems:   ***  Past Medical History:  She,  has a past medical history of VP (ventriculoperitoneal) shunt status.   Surgical History:   Past Surgical History:  Procedure Laterality Date  . DG HYSTEROGRAM (HSG)  10/28/2018      . hydroceph    . SHUNT REVISION VENTRICULAR-PERITONEAL       Social History:   reports that she has never smoked. She has never used smokeless tobacco. She reports that she does not drink alcohol and does not use drugs.    Family History:  Her family history is not on file.   Allergies No Known Allergies   Home Medications  Prior to Admission medications   Medication Sig Start Date End Date  Taking? Authorizing Provider  ondansetron (ZOFRAN ODT) 4 MG disintegrating tablet Take 1 tablet (4 mg total) by mouth every 6 (six) hours as needed for nausea. 09/12/19  Yes Schuman, Jaquelyn Bitter, MD     Critical care time: ***

## 2020-02-01 NOTE — Progress Notes (Signed)
Pt transferred from [facility] on [facility] 45% & 30L.  Pt experiences dyspnea with contractions.  OB consulted & Rapid Response OB RN made aware of pt's arrival with OB assessment pending.  Joneen Roach, CCM at bedside.  Will continue to monitor.

## 2020-02-01 NOTE — Progress Notes (Signed)
Fabio Neighbors, RN & Rapid Response OB has completed initial OB exam, & made Dr. Despina Hidden aware of pt's arrrival.

## 2020-02-01 NOTE — Progress Notes (Signed)
Dr. Onnie Boer at bedside.  No new orders received.

## 2020-02-01 NOTE — Progress Notes (Addendum)
Patient seen and examined by me. She states that she feels comfortable and not short of breath. She notes +FM, no LOF, no vaginal bleeding, and no contractions.  BP 122/77 (BP Location: Right Arm)   Pulse 85   Temp (!) 97.5 F (36.4 C) (Oral)   Resp (!) 32   Ht 5\' 4"  (1.626 m)   Wt 86.6 kg   LMP 05/18/2019   SpO2 96%   BMI 32.79 kg/m   Gen: NAD, HFNC in place Resp: lung with bibasilar crackles, rales more scattered.   CV: RRR, mild generalized edema Abd: gravid, nt Ext: SCDs in place.    EFM: 140/+accels/no decels/mod var Toco: quiet  A/P: 35 y.o. G44P0000 female with acute respiratory failure secondary to covid19 pneumonia requiring high flow O2 .    -Stable at this time for transfer to nearby facility. -plan of transfer and reasoning discussed in detail with patient, who agrees to the transfer. All questions answered. - See DC summary for more details.   09-19-1993, MD, Thomas[facility] Mohair [facility], [facility] 02/01/2020 5:48 PM    Patient reassessed again due to greater than 1 hour passing from last assessment and transport. No changes from above.  Stable for transport.   03/31/2020, MD, Thomasene Mohair OB/GYN, [facility] 02/01/2020 7:00 PM

## 2020-02-01 NOTE — H&P (Addendum)
NAME:  Vicki Mcdowell, MRN:  [id number], DOB:  09-12-1985, LOS: 0 ADMISSION DATE:  (Not on file), CONSULTATION DATE:  02/01/20 REFERRING MD:  [place]:  Dyspnea   Brief History:  Vicki Mcdowell is a 35 y.o. F G1P0000 at [redacted]w[redacted]d by LMP, admitted to [facility] 12/31 with COVID PNA. Transferred to [facility] ICU 1/5 due to increased O2 requirements.  History of Present Illness:  Vicki Mcdowell is a 35 y.o. F G1P0000 at [redacted]w[redacted]d by LMP.  She was admitted to [facility] 12/31 with dyspnea x 2 days 2/2 COVID PNA.  Her whole household had been infected prior to her being symptomatic.  She was unvaccinated.  She was initially on room air; however, began to have new O2 requirement of 2L via [place] on 1/1.  She was started on Remdesivir and Decadron.  On 1/4, she had desaturation with proning and persisted after back supine.  O2 increased to [place].  By 1/5, she was up to 15L with any exertion.  Due to increasing O2 requirements, it was felt that she should be transferred to [facility] ICU for further management.  Past Medical History:  Hydrocephalus s/p VP shunt.  Significant Hospital Events:  12/31 > admit. 1/5 > transfer to [facility] ICU.  Consults:  PCCM.  Procedures:  None.  Significant Diagnostic Tests:  Abd [place] 12/31 > cholelithiasis, no cholecystitis. LE Duplex 1/2 > neg.  Micro Data:  None.  Antimicrobials:  None.   Interim History / Subjective:  Breathing comfortably currently, does occasionally get winded with long conversations. Productive cough for clear mucous.  Objective   Last menstrual period 05/18/2019.    FiO2 (%):  [35 %] 35 %  No intake or output data in the 24 hours ending 02/01/20 1920 There were no vitals filed for this visit.  Examination: General: young adult female in NAD HENT: [place]/AT, PERRL, no JVD Lungs: Clear bilateral breath sounds. Tachypnea. On 45% 30LPM HHFNC with sats 94-96. Cardiovascular: RRR, no MRG Abdomen: Protuberant. Fetal heart rate 130-140 bpm Extremities:  No acute deformity. 2+ non-pitting edema in bilateral lower extremities.  Neuro: Alert, oriented, non-focal.   Assessment & Plan:   Acute hypoxic respiratory failure - 2/2 COVID PNA.  S/p course of Remdesivir, currently on day 5/10 Decadron. - Continue supplemental O2 as needed to maintain SpO2 > 94% per OBGYN.  Currently on 45% 30LPM HHFNC. - Continue Decadron for 10 total days.  - Encouraged position changes and deep breathing as tolerated.  - Consider diuresis. Echo pending rule out peripartum cardiomyopathy considering her peripheral edema in conjunction with hypoxemia.  Term Pregnancy [redacted] wk gestation  - OB is aware she has arrived and has recommended NST BID - Consult pending.   Elevated D-Dimer - LE duplex neg.  CTA restricted due to pregnancy. - Continue Lovenox.  Transaminitis - 2/2 COVID + Remdesivir. Hyperbilirubinemia - improving, unclear etiology. Abd [place] with cholelithiasis and no evidence of cholecystitis - Trend.  Hx hydrocephalus - s/p VP shunt placement. - No interventions required  Best practice (evaluated daily)  Diet: Regular. Pain/Anxiety/Delirium protocol (if indicated): None. VAP protocol (if indicated): None. DVT prophylaxis: Lovenox / SCD's. GI prophylaxis: None. Glucose control: SSI if glucose consistently > 180. Mobility: Bedrest. Disposition:ICU.  Goals of Care:  Last date of multidisciplinary goals of care discussion:None. Family and staff present: None. Summary of discussion: None. Follow up goals of care discussion due: None. Code Status: Full.  Labs   CBC: Recent Labs  Lab 01/28/20 1523  01/29/20 0647 01/30/20 0657 01/31/20 0323 02/01/20 0617  WBC 6.6 8.6 11.8* 8.4 10.1  HGB 11.8* 12.5 13.6 12.0 13.0  HCT 34.4* 36.8 41.1 35.0* 37.6  MCV 86.0 87.0 88.2 86.8 85.8  PLT 188 227 339 285 330    Basic Metabolic Panel: Recent Labs  Lab 01/27/20 1931 01/28/20 0606 01/28/20 1523 01/29/20 0647 01/30/20 0657 01/31/20 0323  02/01/20 0617  NA  --  132*  --  138 141 139 138  K  --  3.6  --  3.6 4.0 3.8 3.6  CL  --  103  --  110 114* 115* 115*  CO2  --  18*  --  16* 15* 17* 16*  GLUCOSE  --  93  --  96 101* 99 93  BUN  --  8  --  8 11 12 14   CREATININE  --  0.73 0.71 0.81 0.87 0.65 0.77  CALCIUM  --  7.9*  --  8.1* 8.2* 7.8* 7.8*  MG 1.7  --   --  1.8  --   --   --    GFR: Estimated Creatinine Clearance: 104.6 mL/min (by C-G formula based on SCr of 0.77 mg/dL). Recent Labs  Lab 01/27/20 1931 01/27/20 2250 01/28/20 0606 01/29/20 0647 01/30/20 0657 01/31/20 0323 02/01/20 0617  PROCALCITON <0.10  --   --   --   --   --   --   WBC  --   --    < > 8.6 11.8* 8.4 10.1  LATICACIDVEN 1.9 1.5  --   --   --   --   --    < > = values in this interval not displayed.    Liver Function Tests: Recent Labs  Lab 01/28/20 0606 01/29/20 0647 01/30/20 0657 01/31/20 0323 02/01/20 0617  AST 256* 611* 465* 208* 76*  ALT 160* 376* 346* 227* 157*  ALKPHOS 134* 144* 158* 126 148*  BILITOT 2.0* 2.1* 2.1* 1.4* 1.5*  PROT 5.3* 5.3* 5.7* 4.5* 5.1*  ALBUMIN 2.1* 1.9* 2.1* 1.6* 1.9*   No results for input(s): LIPASE, AMYLASE in the last 168 hours. No results for input(s): AMMONIA in the last 168 hours.  ABG No results found for: PHART, PCO2ART, PO2ART, HCO3, TCO2, ACIDBASEDEF, O2SAT   Coagulation Profile: No results for input(s): INR, PROTIME in the last 168 hours.  Cardiac Enzymes: No results for input(s): CKTOTAL, CKMB, CKMBINDEX, TROPONINI in the last 168 hours.  HbA1C: Hgb A1c MFr Bld  Date/Time Value Ref Range Status  12/10/2018 02:23 PM 5.4 4.8 - 5.6 % Final    Comment:             Prediabetes: 5.7 - 6.4          Diabetes: >6.4          Glycemic control for adults with diabetes: <7.0     CBG: Recent Labs  Lab 01/31/20 0933 01/31/20 1223 01/31/20 1836 02/01/20 1214 02/01/20 1717  GLUCAP 87 84 92 104* 93    Review of Systems:   Bolds are positive  Constitutional: weight loss, gain, night  sweats, Fevers, chills, fatigue .  HEENT: headaches, Sore throat, sneezing, nasal congestion, post nasal drip, Difficulty swallowing, Tooth/dental problems, visual complaints visual changes, ear ache CV:  chest pain, radiates:,Orthopnea, PND, swelling in lower extremities, dizziness, palpitations, syncope.  GI  heartburn, indigestion, abdominal pain, nausea, vomiting, diarrhea, change in bowel habits, loss of appetite, bloody stools.  Resp: cough, productive: , hemoptysis, dyspnea, chest pain,  pleuritic.  Skin: rash or itching or icterus GU: dysuria, change in color of urine, urgency or frequency. flank pain, hematuria  MS: joint pain or swelling. decreased range of motion  Psych: change in mood or affect. depression or anxiety.  Neuro: difficulty with speech, weakness, numbness, ataxia    Past Medical History:  She,  has a past medical history of VP (ventriculoperitoneal) shunt status.   Surgical History:   Past Surgical History:  Procedure Laterality Date  . DG HYSTEROGRAM (HSG)  10/28/2018      . hydroceph    . SHUNT REVISION VENTRICULAR-PERITONEAL       Social History:   reports that she has never smoked. She has never used smokeless tobacco. She reports that she does not drink alcohol and does not use drugs.   Family History:  Her family history is not on file.   Allergies No Known Allergies   Home Medications  Prior to Admission medications   Medication Sig Start Date End Date Taking? Authorizing Provider  ondansetron (ZOFRAN ODT) 4 MG disintegrating tablet Take 1 tablet (4 mg total) by mouth every 6 (six) hours as needed for nausea. 09/12/19  Yes [name], MD     Critical care time:     Joneen Roach, AGACNP-BC [facility]  See Amion for personal pager PCCM on call pager [phone]  02/01/2020 8:56 PM

## 2020-02-01 NOTE — Progress Notes (Addendum)
Daily Antepartum Note  Admission Date: 01/27/2020 Current Date: 02/01/2020 12:27 PM  Vicki Mcdowell is a 35 y.o. G1P0000 @ [redacted]w[redacted]d by LMP (c/w 7w [place]), HD#6, admitted for COVID19 pneumonia affecting pregnancy.   Pregnancy complicated by: Patient Active Problem List   Diagnosis Date Noted  . COVID-19 affecting pregnancy in third trimester 01/27/2020  . Transaminitis 01/27/2020  . Pneumonia due to COVID-19 virus 01/27/2020  . Shortness of breath after COVID-19 vaccination 01/27/2020  . VP (ventriculoperitoneal) shunt status   . Obesity in pregnancy, antepartum, third trimester 01/12/2020  . Obesity, Class I, BMI 30-34.9 01/12/2020  . Supervision of other normal pregnancy, antepartum 07/08/2019    Overnight/24hr events:  Patient requiring increased in O2 supplementation with any movement out of bed (up to immediate bedside commode), utilizing 15l NRB with HFNC. Additionally, patient noted overnight to have decrease in HR to 50 bmp which recovered to 60-70 bmp. Patient was asymptomatic with these events.  Subjective:  Patient states she is "feeling better" today. Wishes she could go home. Patient with increased WOB while talking. Patient reports +FM. Denies ctx, VB, or LOF.   Objective:   Vitals:   02/01/20 1110 02/01/20 1218  BP:  121/74  Pulse: 65 74  Resp: (!) 24 (!) 23  Temp:  97.8 F (36.6 C)  SpO2: 95% 96%   Temp:  [97 F (36.1 C)-98.4 F (36.9 C)] 97.8 F (36.6 C) (01/05 1218) Pulse Rate:  [53-109] 74 (01/05 1218) Resp:  [17-37] 23 (01/05 1218) BP: (110-121)/(56-74) 121/74 (01/05 1218) SpO2:  [89 %-99 %] 96 % (01/05 1218) FiO2 (%):  [35 %] 35 % (01/05 1105) Temp (24hrs), Avg:97.8 F (36.6 C), Min:97 F (36.1 C), Max:98.4 F (36.9 C)   Intake/Output Summary (Last 24 hours) at 02/01/2020 1227 Last data filed at 02/01/2020 1050 Gross per 24 hour  Intake 240 ml  Output 1000 ml  Net -760 ml     Current Vital Signs 24h Vital Sign Ranges  T 97.8 F (36.6 C) Temp   Avg: 97.8 F (36.6 C)  Min: 97 F (36.1 C)  Max: 98.4 F (36.9 C)  BP 121/74 BP  Min: 110/65  Max: 121/74  HR 74 Pulse  Avg: 70.6  Min: 53  Max: 109  RR (!) 23 Resp  Avg: 26.8  Min: 17  Max: 37  SaO2 96 % High Flow Nasal Cannula SpO2  Avg: 95.1 %  Min: 89 %  Max: 99 %       24 Hour I/O Current Shift I/O  Time Ins Outs 01/04 0701 - 01/05 0700 In: -  Out: 700 [Urine:700] 01/05 0701 - 01/05 1900 In: 240 [P.O.:240] Out: 300 [Urine:300]   Patient Vitals for the past 24 hrs:  BP Temp Temp src Pulse Resp SpO2  02/01/20 1218 121/74 97.8 F (36.6 C) Oral 74 (!) 23 96 %  02/01/20 1110 - - - 65 (!) 24 95 %  02/01/20 1109 - - - 72 (!) 27 95 %  02/01/20 1108 - - - 72 (!) 27 94 %  02/01/20 1107 - - - 73 (!) 24 94 %  02/01/20 1106 - - - 63 (!) 29 95 %  02/01/20 1105 - - - 65 (!) 27 94 %  02/01/20 1104 - - - 65 (!) 35 93 %  02/01/20 1103 - - - 72 (!) 32 93 %  02/01/20 1102 - - - 73 (!) 24 93 %  02/01/20 1101 - - - 78 (!) 32  93 %  02/01/20 1100 - - - 73 (!) 30 93 %  02/01/20 1059 - - - 71 (!) 24 92 %  02/01/20 1058 - - - 67 (!) 37 94 %  02/01/20 1057 - - - 68 (!) 25 92 %  02/01/20 1056 - - - 87 (!) 36 91 %  02/01/20 1055 - - - (!) 109 (!) 25 90 %  02/01/20 1054 - - - 97 18 96 %  02/01/20 1053 - - - 93 (!) 23 97 %  02/01/20 1052 - - - 97 (!) 28 97 %  02/01/20 1051 - - - - 18 -  02/01/20 1050 - - - 82 (!) 33 99 %  02/01/20 1049 - - - 79 (!) 28 97 %  02/01/20 1048 - - - 91 (!) 25 98 %  02/01/20 1047 - - - 79 (!) 30 96 %  02/01/20 1046 - - - 75 (!) 35 95 %  02/01/20 1045 - - - 80 (!) 25 95 %  02/01/20 1044 - - - 74 (!) 21 95 %  02/01/20 1043 - - - 70 (!) 27 95 %  02/01/20 1042 - - - 66 (!) 31 95 %  02/01/20 1041 - - - 74 (!) 28 94 %  02/01/20 1040 - - - 71 (!) 25 95 %  02/01/20 1000 - - - 60 (!) 34 95 %  02/01/20 0945 - - - 71 (!) 25 96 %  02/01/20 0930 - - - 60 (!) 27 97 %  02/01/20 0915 - - - 64 (!) 29 95 %  02/01/20 0900 - - - 68 (!) 34 96 %  02/01/20 0845 - - - (!) 59 (!) 25  97 %  02/01/20 0736 - - - 67 (!) 26 96 %  02/01/20 0731 - - - 62 (!) 22 96 %  02/01/20 0726 - - - 61 (!) 29 96 %  02/01/20 0721 110/65 97.8 F (36.6 C) Axillary 64 (!) 22 97 %  02/01/20 0716 - - - 60 (!) 27 97 %  02/01/20 0711 - - - (!) 53 (!) 25 97 %  02/01/20 0706 - - - (!) 55 (!) 26 97 %  02/01/20 0701 - - - (!) 54 (!) 22 97 %  02/01/20 0656 - - - (!) 54 (!) 27 97 %  02/01/20 0651 - (!) 97 F (36.1 C) Oral 77 17 94 %  02/01/20 0647 - - - (!) 55 (!) 26 97 %  02/01/20 0645 - - - (!) 56 (!) 26 97 %  02/01/20 0636 - - - (!) 58 (!) 32 96 %  02/01/20 0631 - - - 60 (!) 32 95 %  02/01/20 0613 116/74 - - 80 - -  02/01/20 0554 - - - - - 96 %  02/01/20 0549 - - - - - 96 %  02/01/20 0544 - - - - - 95 %  02/01/20 0534 - 98 F (36.7 C) Oral - - 93 %  02/01/20 0529 - - - - - 91 %  02/01/20 0419 - - - - - 95 %  02/01/20 0414 - - - - - 95 %  02/01/20 0409 - - - - - 94 %  02/01/20 0349 - - - - - 97 %  02/01/20 0344 - - - - - 97 %  02/01/20 0339 - - - - - 95 %  02/01/20 0159 - - - - - 93 %  02/01/20  0158 (!) 118/56 97.8 F (36.6 C) Oral 69 (!) 24 -  02/01/20 0000 - 98.2 F (36.8 C) Oral - - -  01/31/20 2359 - - - - - 97 %  01/31/20 2354 - - - - - 97 %  01/31/20 2349 - - - - - 96 %  01/31/20 2344 - - - - - 96 %  01/31/20 2339 - - - - - 96 %  01/31/20 2334 - - - - - 96 %  01/31/20 2329 - - - - - 96 %  01/31/20 2115 - - - - - 96 %  01/31/20 2051 118/62 98.4 F (36.9 C) Oral 72 (!) 22 96 %  01/31/20 1635 - (!) 97.5 F (36.4 C) Oral - 20 -  01/31/20 1234 - - - - - (!) 89 %  01/31/20 1229 - - - - - (!) 89 %    Physical exam: General: Well nourished, well developed female, increased WOB Abdomen: gravid (size consistent with [redacted] wk gestation) Cardiovascular: S1 and S2 normal Respiratory: diminished lung sounds bilaterally, tachypneic at rest with RR 24-30. Extremities: no clubbing, cyanosis or edema Skin: Warm and dry.   Medications: Current Facility-Administered Medications   Medication Dose Route Frequency Provider Last Rate Last Admin  . 0.9 %  sodium chloride infusion   Intravenous Continuous [name], CNM 125 mL/hr at 02/01/20 0727 New Bag at 02/01/20 0727  . acetaminophen (TYLENOL) tablet 650 mg  650 mg Oral Q6H PRN [name], CNM   650 mg at 01/31/20 0018  . ascorbic acid (VITAMIN C) tablet 250 mg  250 mg Oral Daily Schuman, Christanna R, MD   250 mg at 02/01/20 1038  . chlorpheniramine-HYDROcodone (TUSSIONEX) 10-8 MG/5ML suspension 5 mL  5 mL Oral Q12H PRN Conard Novak, MD   5 mL at 01/30/20 1020  . dexamethasone (DECADRON) injection 6 mg  6 mg Intravenous Q24H Schuman, Christanna R, MD   6 mg at 01/31/20 1642  . enoxaparin (LOVENOX) injection 40 mg  40 mg Subcutaneous Q24H Schuman, Christanna R, MD   40 mg at 01/31/20 1831  . feeding supplement (ENSURE ENLIVE / ENSURE PLUS) liquid 237 mL  237 mL Oral BID BM Schuman, Christanna R, MD   237 mL at 02/01/20 1211  . guaiFENesin-dextromethorphan (ROBITUSSIN DM) 100-10 MG/5ML syrup 10 mL  10 mL Oral Q4H PRN Schuman, Christanna R, MD   10 mL at 01/31/20 1641  . insulin aspart (novoLOG) injection 0-15 Units  0-15 Units Subcutaneous TID WC Conard Novak, MD      . ondansetron [facility]) injection 4 mg  4 mg Intravenous Q6H PRN Conard Novak, MD   4 mg at 01/28/20 1810  . ondansetron (ZOFRAN-ODT) disintegrating tablet 4 mg  4 mg Oral Q6H PRN Conard Novak, MD      . zinc sulfate capsule 220 mg  220 mg Oral Daily Schuman, Christanna R, MD   220 mg at 02/01/20 1039    Labs:  Recent Labs  Lab 01/30/20 0657 01/31/20 0323 02/01/20 0617  WBC 11.8* 8.4 10.1  HGB 13.6 12.0 13.0  HCT 41.1 35.0* 37.6  PLT 339 285 330    Recent Labs  Lab 01/30/20 0657 01/31/20 0323 02/01/20 0617  NA 141 139 138  K 4.0 3.8 3.6  CL 114* 115* 115*  CO2 15* 17* 16*  BUN 11 12 14   CREATININE 0.87 0.65 0.77  CALCIUM 8.2* 7.8* 7.8*  PROT 5.7* 4.5* 5.1*  BILITOT 2.1*  1.4* 1.5*  ALKPHOS 158* 126 148*   ALT 346* 227* 157*  AST 465* 208* 76*  GLUCOSE 101* 99 93     Radiology:  01/29/20 - Stable bilateral COVID-19 pneumonia since 01/27/20.  Assessment & Plan:  35 y.o. G1P0000 at 37w admitted with COVID19 pneumonia with increased O2 requirements over the last 24 hours. Patient minimally able to tolerate movement without additional O2 support. New onset, intermittent bradycardia. Decreased UOP today.    *Pregnancy: Continue qshift NSTs *COVID19 pneumonia: Now severe disease, as defined by hypoxia with oxygen saturation less than 94% ([facility]: Management Considerations for Pregnant Patients With COVID-19).  4th dose of Remdesivir today. Continue decadron, Zinc, Vitamin C. Patient requiring increase in O2 supplementation - now HFNC. Titrate down as tolerated. Appreciate further recommendations from hospitalist service. Will obtain ABG. EKG for change in HR. *Term today *PPx: SCDs, lovenox *FEN/GI: MIVF, regular diet *Dispo: Will discuss transfer to higher level of care given increase in O2 requirements *Reviewed plan with Dr. Jean Rosenthal

## 2020-02-02 ENCOUNTER — Inpatient Hospital Stay (HOSPITAL_COMMUNITY): Payer: Medicaid Other

## 2020-02-02 ENCOUNTER — Inpatient Hospital Stay (HOSPITAL_BASED_OUTPATIENT_CLINIC_OR_DEPARTMENT_OTHER): Payer: Medicaid Other

## 2020-02-02 DIAGNOSIS — O289 Unspecified abnormal findings on antenatal screening of mother: Secondary | ICD-10-CM

## 2020-02-02 DIAGNOSIS — Z3A37 37 weeks gestation of pregnancy: Secondary | ICD-10-CM | POA: Diagnosis not present

## 2020-02-02 DIAGNOSIS — Z3A36 36 weeks gestation of pregnancy: Secondary | ICD-10-CM

## 2020-02-02 DIAGNOSIS — R0602 Shortness of breath: Secondary | ICD-10-CM

## 2020-02-02 DIAGNOSIS — U071 COVID-19: Secondary | ICD-10-CM

## 2020-02-02 DIAGNOSIS — O98513 Other viral diseases complicating pregnancy, third trimester: Secondary | ICD-10-CM

## 2020-02-02 DIAGNOSIS — R0600 Dyspnea, unspecified: Secondary | ICD-10-CM

## 2020-02-02 LAB — PHOSPHORUS: Phosphorus: 3.7 mg/dL (ref 2.5–4.6)

## 2020-02-02 LAB — CBC WITH DIFFERENTIAL/PLATELET
Abs Immature Granulocytes: 1.16 10*3/uL — ABNORMAL HIGH (ref 0.00–0.07)
Basophils Absolute: 0.1 10*3/uL (ref 0.0–0.1)
Basophils Relative: 1 %
Eosinophils Absolute: 0 10*3/uL (ref 0.0–0.5)
Eosinophils Relative: 0 %
HCT: 39.7 % (ref 36.0–46.0)
Hemoglobin: 13.2 g/dL (ref 12.0–15.0)
Immature Granulocytes: 12 %
Lymphocytes Relative: 19 %
Lymphs Abs: 1.9 10*3/uL (ref 0.7–4.0)
MCH: 28.5 pg (ref 26.0–34.0)
MCHC: 33.2 g/dL (ref 30.0–36.0)
MCV: 85.7 fL (ref 80.0–100.0)
Monocytes Absolute: 0.3 10*3/uL (ref 0.1–1.0)
Monocytes Relative: 3 %
Neutro Abs: 6.3 10*3/uL (ref 1.7–7.7)
Neutrophils Relative %: 65 %
Platelets: 343 10*3/uL (ref 150–400)
RBC: 4.63 MIL/uL (ref 3.87–5.11)
RDW: 13 % (ref 11.5–15.5)
WBC: 9.7 10*3/uL (ref 4.0–10.5)
nRBC: 2.5 % — ABNORMAL HIGH (ref 0.0–0.2)

## 2020-02-02 LAB — COMPREHENSIVE METABOLIC PANEL
ALT: 112 U/L — ABNORMAL HIGH (ref 0–44)
AST: 38 U/L (ref 15–41)
Albumin: 1.9 g/dL — ABNORMAL LOW (ref 3.5–5.0)
Alkaline Phosphatase: 156 U/L — ABNORMAL HIGH (ref 38–126)
Anion gap: 12 (ref 5–15)
BUN: 15 mg/dL (ref 6–20)
CO2: 15 mmol/L — ABNORMAL LOW (ref 22–32)
Calcium: 8.5 mg/dL — ABNORMAL LOW (ref 8.9–10.3)
Chloride: 111 mmol/L (ref 98–111)
Creatinine, Ser: 0.86 mg/dL (ref 0.44–1.00)
GFR, Estimated: >60 mL/min (ref >60)
Glucose, Bld: 105 mg/dL — ABNORMAL HIGH (ref 70–99)
Potassium: 3.8 mmol/L (ref 3.5–5.1)
Sodium: 138 mmol/L (ref 135–145)
Total Bilirubin: 1.5 mg/dL — ABNORMAL HIGH (ref 0.3–1.2)
Total Protein: 4.8 g/dL — ABNORMAL LOW (ref 6.5–8.1)

## 2020-02-02 LAB — MAGNESIUM: Magnesium: 1.7 mg/dL (ref 1.7–2.4)

## 2020-02-02 LAB — TYPE AND SCREEN
ABO/RH(D): A POS
Antibody Screen: NEGATIVE

## 2020-02-02 LAB — ECHOCARDIOGRAM LIMITED
Area-P 1/2: 3.99 cm2
S' Lateral: 2.9 cm
Weight: 3139.35 oz

## 2020-02-02 LAB — D-DIMER, QUANTITATIVE: D-Dimer, Quant: 4.63 ug/mL-FEU — ABNORMAL HIGH (ref 0.00–0.50)

## 2020-02-02 LAB — ABO/RH: ABO/RH(D): A POS

## 2020-02-02 MED ORDER — LACTATED RINGERS IV SOLN: INTRAVENOUS | Status: DC

## 2020-02-02 MED ORDER — CHLORHEXIDINE GLUCONATE CLOTH 2 % EX PADS
6.0000 | MEDICATED_PAD | Freq: Every day | CUTANEOUS | Status: DC
  Administered 2020-02-02: 6 via TOPICAL

## 2020-02-02 NOTE — Progress Notes (Signed)
Talked with Dr Shawnie Pons and Dr Donavan Foil on the phone.  BPP ordered.  Will continue to monitor.  Dr Donavan Foil will be up to assess this AM.

## 2020-02-02 NOTE — Progress Notes (Signed)
OBIX working from L+D.  Will continue to monitor from L+D.

## 2020-02-02 NOTE — Progress Notes (Signed)
   OB interim progress note:  S: Called to see patient regarding fetal tracing.  Tracing was described as having minimal variability and occasional late deceleration.  Pt did receive small fluid bolus and the variability has improved.  MD went to ICU and viewed fetal tracing directly.    O: n/a  A/P: Covid 19 pneumonia, nonreactive/nonreassuring  Pt currently on 25 L  Oxygen with high flow nasal cannula. BPP ordered Have discussed pt in detail with MFM, agrees with continued monitoring and BPP. Once BPP returns will decide if pt needs delivery for continued improvement and safety of the baby.   Mariel Aloe, MD Faculty attending, [facility] [employer].

## 2020-02-02 NOTE — Progress Notes (Signed)
  Echocardiogram 2D Echocardiogram has been performed.  Vicki Mcdowell 02/02/2020, 4:27 PM

## 2020-02-02 NOTE — Progress Notes (Signed)
Patient not feeling cxns seen on ext monitor.  Abdomen palpates soft and nontender. Dr Gildardo Griffes notified of NST.  Will continue to monitor. Will start some IV fluids.  Patient has difficulty changing positions.  Is most comfortable in HF for respiratory purposes.

## 2020-02-02 NOTE — Progress Notes (Addendum)
G1P0 at 37 1/7 weeks transferred to [facility] [id number] from [facility] for deteriorating COVID symptoms.  Orders for NST BID.  Monitors applied.  Patient reports good fetal movement.  No c/o cxns, bleeding or leaking. Current VS..Marland KitchenO2 Sats 96 on 25L through HHFNC. NSR.  Afebrile.

## 2020-02-02 NOTE — Progress Notes (Signed)
[place] at bedside for BPP.  Monitors off for procedure.

## 2020-02-02 NOTE — Progress Notes (Signed)
Called to pt. Bedside to evaluate patient. Pt. C/o 8/10 diffuse abdominal pain that is constant. OBRR RN performed sterile cervical exam and cervix is closed. Pt. RN concerned about minimal urine output. Dr. Shawnie Pons notified at this time.

## 2020-02-02 NOTE — Progress Notes (Signed)
Patient ID: Vicki Mcdowell, female   DOB: [birth year], 35 y.o.   MRN: [id number] FACULTY PRACTICE ANTEPARTUM(COMPREHENSIVE) NOTE  Vicki Mcdowell is a 35 y.o. G1P0000 with Estimated Date of Delivery: 02/22/20   By   [redacted]w[redacted]d  who is admitted for COVID 19 pneumonia.    Fetal presentation is cephalic. Length of Stay:  1  Days  Date of admission:02/01/2020  Subjective: Pt feels better than she did yesterday she states Patient reports the fetal movement as active. Patient reports uterine contraction  activity as none. Patient reports  vaginal bleeding as none. Patient describes fluid per vagina as None.  Vitals:  Blood pressure 109/64, pulse 72, temperature 97.8 F (36.6 C), temperature source Oral, resp. rate (!) 25, weight 89 kg, last menstrual period 05/18/2019, SpO2 96 %. Vitals:   02/02/20 0837 02/02/20 0851 02/02/20 0854 02/02/20 0906  BP:      Pulse: 78 90 79 72  Resp: (!) 27 (!) 28 (!) 29 (!) 25  Temp:      TempSrc:      SpO2:   96% 96%  Weight:       Physical Examination:  General appearance - alert, well appearing, and in no distress Abdomen - soft, nontender, nondistended, no masses or organomegaly Fundal Height:  size equals dates Pelvic Exam:  examination not indicated Cervical Exam: Not evaluated.  Extremities: extremities normal, atraumatic, no cyanosis or edema with DTRs 2+ bilaterally Membranes:intact  Fetal Monitoring:  Pending testing     Labs:  Results for orders placed or performed during the hospital encounter of 02/01/20 (from the past 24 hour(s))  Glucose, capillary   Collection Time: 02/01/20  8:27 PM  Result Value Ref Range   Glucose-Capillary 124 (H) 70 - 99 mg/dL  CBC   Collection Time: 02/01/20 11:06 PM  Result Value Ref Range   WBC 10.2 4.0 - 10.5 K/uL   RBC 4.34 3.87 - 5.11 MIL/uL   Hemoglobin 13.0 12.0 - 15.0 g/dL   HCT 62.1 30.8 - 65.7 %   MCV 85.5 80.0 - 100.0 fL   MCH 30.0 26.0 - 34.0 pg   MCHC 35.0 30.0 - 36.0 g/dL   RDW 84.6 96.2 - 95.2 %    Platelets 324 150 - 400 K/uL   nRBC 2.6 (H) 0.0 - 0.2 %  Creatinine, serum   Collection Time: 02/01/20 11:06 PM  Result Value Ref Range   Creatinine, Ser 0.88 0.44 - 1.00 mg/dL   GFR, Estimated >84 >13 mL/min  CBC with Differential/Platelet   Collection Time: 02/02/20  6:35 AM  Result Value Ref Range   WBC 9.7 4.0 - 10.5 K/uL   RBC 4.63 3.87 - 5.11 MIL/uL   Hemoglobin 13.2 12.0 - 15.0 g/dL   HCT 24.4 01.0 - 27.2 %   MCV 85.7 80.0 - 100.0 fL   MCH 28.5 26.0 - 34.0 pg   MCHC 33.2 30.0 - 36.0 g/dL   RDW 53.6 64.4 - 03.4 %   Platelets 343 150 - 400 K/uL   nRBC 2.5 (H) 0.0 - 0.2 %   Neutrophils Relative % 65 %   Neutro Abs 6.3 1.7 - 7.7 K/uL   Lymphocytes Relative 19 %   Lymphs Abs 1.9 0.7 - 4.0 K/uL   Monocytes Relative 3 %   Monocytes Absolute 0.3 0.1 - 1.0 K/uL   Eosinophils Relative 0 %   Eosinophils Absolute 0.0 0.0 - 0.5 K/uL   Basophils Relative 1 %   Basophils Absolute 0.1 0.0 -  0.1 K/uL   WBC Morphology See Note    Immature Granulocytes 12 %   Abs Immature Granulocytes 1.16 (H) 0.00 - 0.07 K/uL   Polychromasia PRESENT   Comprehensive metabolic panel   Collection Time: 02/02/20  6:35 AM  Result Value Ref Range   Sodium 138 135 - 145 mmol/L   Potassium 3.8 3.5 - 5.1 mmol/L   Chloride 111 98 - 111 mmol/L   CO2 15 (L) 22 - 32 mmol/L   Glucose, Bld 105 (H) 70 - 99 mg/dL   BUN 15 6 - 20 mg/dL   Creatinine, Ser 6.27 0.44 - 1.00 mg/dL   Calcium 8.5 (L) 8.9 - 10.3 mg/dL   Total Protein 4.8 (L) 6.5 - 8.1 g/dL   Albumin 1.9 (L) 3.5 - 5.0 g/dL   AST 38 15 - 41 U/L   ALT 112 (H) 0 - 44 U/L   Alkaline Phosphatase 156 (H) 38 - 126 U/L   Total Bilirubin 1.5 (H) 0.3 - 1.2 mg/dL   GFR, Estimated >03 >50 mL/min   Anion gap 12 5 - 15  D-dimer, quantitative (not at [facility])   Collection Time: 02/02/20  6:35 AM  Result Value Ref Range   D-Dimer, Quant 4.63 (H) 0.00 - 0.50 ug/mL-FEU  Magnesium   Collection Time: 02/02/20  6:35 AM  Result Value Ref Range   Magnesium 1.7 1.7 -  2.4 mg/dL  Phosphorus   Collection Time: 02/02/20  6:35 AM  Result Value Ref Range   Phosphorus 3.7 2.5 - 4.6 mg/dL  Results for orders placed or performed during the hospital encounter of 01/27/20 (from the past 24 hour(s))  Glucose, capillary   Collection Time: 02/01/20 12:14 PM  Result Value Ref Range   Glucose-Capillary 104 (H) 70 - 99 mg/dL  C-reactive protein   Collection Time: 02/01/20  2:00 PM  Result Value Ref Range   CRP 1.4 (H) <1.0 mg/dL  D-dimer, quantitative (not at [facility])   Collection Time: 02/01/20  2:01 PM  Result Value Ref Range   D-Dimer, Quant 5.01 (H) 0.00 - 0.50 ug/mL-FEU  Glucose, capillary   Collection Time: 02/01/20  5:17 PM  Result Value Ref Range   Glucose-Capillary 93 70 - 99 mg/dL    Imaging Studies:    US Venous Img Lower Bilateral (DVT)  Result Date: 01/29/2020 CLINICAL DATA:  COVID-19 pneumonia and elevated D-dimer. EXAM: BILATERAL LOWER EXTREMITY VENOUS DOPPLER ULTRASOUND TECHNIQUE: Gray-scale sonography with graded compression, as well as color Doppler and duplex ultrasound were performed to evaluate the lower extremity deep venous systems from the level of the common femoral vein and including the common femoral, femoral, profunda femoral, popliteal and calf veins including the posterior tibial, peroneal and gastrocnemius veins when visible. The superficial great saphenous vein was also interrogated. Spectral Doppler was utilized to evaluate flow at rest and with distal augmentation maneuvers in the common femoral, femoral and popliteal veins. COMPARISON:  None. FINDINGS: RIGHT LOWER EXTREMITY Common Femoral Vein: No evidence of thrombus. Normal compressibility, respiratory phasicity and response to augmentation. Saphenofemoral Junction: No evidence of thrombus. Normal compressibility and flow on color Doppler imaging. Profunda Femoral Vein: No evidence of thrombus. Normal compressibility and flow on color Doppler imaging. Femoral Vein: No evidence of  thrombus. Normal compressibility, respiratory phasicity and response to augmentation. Popliteal Vein: No evidence of thrombus. Normal compressibility, respiratory phasicity and response to augmentation. Calf Veins: No evidence of thrombus. Normal compressibility and flow on color Doppler imaging. Superficial Great Saphenous Vein: No evidence of  thrombus. Normal compressibility. Venous Reflux:  None. Other Findings: No evidence of superficial thrombophlebitis or abnormal fluid collection. LEFT LOWER EXTREMITY Common Femoral Vein: No evidence of thrombus. Normal compressibility, respiratory phasicity and response to augmentation. Saphenofemoral Junction: No evidence of thrombus. Normal compressibility and flow on color Doppler imaging. Profunda Femoral Vein: No evidence of thrombus. Normal compressibility and flow on color Doppler imaging. Femoral Vein: No evidence of thrombus. Normal compressibility, respiratory phasicity and response to augmentation. Popliteal Vein: No evidence of thrombus. Normal compressibility, respiratory phasicity and response to augmentation. Calf Veins: No evidence of thrombus. Normal compressibility and flow on color Doppler imaging. Superficial Great Saphenous Vein: No evidence of thrombus. Normal compressibility. Venous Reflux:  None. Other Findings: No evidence of superficial thrombophlebitis or abnormal fluid collection. IMPRESSION: No evidence of deep venous thrombosis in either lower extremity. Electronically Signed   By: Aletta Edouard M.D.   On: 01/29/2020 11:44   Portable chest 1 View  Result Date: 02/02/2020 CLINICAL DATA:  Pregnant, dyspnea EXAM: PORTABLE CHEST 1 VIEW COMPARISON:  01/29/2020 FINDINGS: Multifocal pulmonary infiltrates are seen scattered throughout the lungs, progressive since prior examination, and likely reflecting changes of atypical infection in the acute setting. Pulmonary insufflation is stable, though lung volumes are small. No pneumothorax or pleural  effusion. Cardiac size within normal limits. Ventriculoperitoneal shunt catheter tubing overlies the right hemithorax. No acute bone abnormality. IMPRESSION: Progressive multifocal pulmonary infiltrates, likely infectious in the acute setting. Electronically Signed   By: Fidela Salisbury MD   On: 02/02/2020 05:04     Medications:  Scheduled . Chlorhexidine Gluconate Cloth  6 each Topical Daily  . dexamethasone (DECADRON) injection  6 mg Intravenous Q24H  . enoxaparin (LOVENOX) injection  40 mg Subcutaneous Q24H  . sodium chloride flush  10-40 mL Intracatheter Q12H     ASSESSMENT: G1P0000 [redacted]w[redacted]d Estimated Date of Delivery: 02/22/20    Patient Active Problem List   Diagnosis Date Noted  . COVID-19 affecting pregnancy in third trimester 01/27/2020  . Transaminitis 01/27/2020  . Pneumonia due to COVID-19 virus 01/27/2020  . Shortness of breath after COVID-19 vaccination 01/27/2020  . VP (ventriculoperitoneal) shunt status   . Obesity in pregnancy, antepartum, third trimester 01/12/2020  . Obesity, Class I, BMI 30-34.9 01/12/2020  . Supervision of other normal pregnancy, antepartum 07/08/2019    PLAN: Patient is being managed primarily by the hospitalist team She is on lovenox and dexamethasone therapy, she has completed her remdesivir 2 days ago Twice daily fetal surveillance is ordered Ideally patient wouldrecover form her pneumonia and then be delivered.  However if her condition deteriorates, there is positive evidence that delivery would improve her clinical condition   Florian Buff 02/02/2020,9:19 AM

## 2020-02-02 NOTE — Progress Notes (Signed)
BPP 6/8. 2 off for breathing.  Dr Donavan Foil updated.  Will consult with MFM on POC.  Monitors reapplied.

## 2020-02-02 NOTE — Progress Notes (Signed)
NAME:  Vicki Mcdowell, MRN:  [id number], DOB:  01-09-1986, LOS: 1 ADMISSION DATE:  02/01/2020, CONSULTATION DATE:  02/01/20 REFERRING MD:  Jean Rosenthal CHIEF COMPLAINT:  Dyspnea   Brief History:  Vicki Mcdowell is a 35 y.o. F G1P0000 at [redacted]w[redacted]d by LMP, admitted to [facility] 12/31 with COVID PNA. Transferred to [facility] ICU 1/5 due to increased O2 requirements.  History of Present Illness:  Vicki Mcdowell is a 35 y.o. F G1P0000 at [redacted]w[redacted]d by LMP.  She was admitted to [facility] 12/31 with dyspnea x 2 days 2/2 COVID PNA.  Her whole household had been infected prior to her being symptomatic.  She was unvaccinated.  She was initially on room air; however, began to have new O2 requirement of 2L via [place] on 1/1.  She was started on Remdesivir and Decadron.  On 1/4, she had desaturation with proning and persisted after back supine.  O2 increased to HHFNC.  By 1/5, she was up to 15L with any exertion.  Due to increasing O2 requirements, it was felt that she should be transferred to [facility] ICU for further management.  Past Medical History:  Hydrocephalus s/p VP shunt.  Significant Hospital Events:  12/31 > admit. 1/5 > transfer to [facility] ICU.  Consults:  PCCM.  Procedures:  None.  Significant Diagnostic Tests:  Abd [place] 12/31 > cholelithiasis, no cholecystitis. LE Duplex 1/2 > neg.  Micro Data:  None.  Antimicrobials:  None.   Interim History / Subjective:   No issues overnight. Remains on HHFNC.   Objective   Blood pressure 109/64, pulse 72, temperature 97.8 F (36.6 C), temperature source Oral, resp. rate (!) 25, weight 89 kg, last menstrual period 05/18/2019, SpO2 96 %.    FiO2 (%):  [35 %-45 %] 35 %  No intake or output data in the 24 hours ending 02/02/20 0911 Filed Weights   02/01/20 2038  Weight: 89 kg    Examination: General: young FM, resting comfortably HENT: NCAT, tracking  Lungs: CTAB, no crackles no wheeze Cardiovascular: RRR, no mrg  Abdomen: gravid  Extremities: BL LE edema  Neuro:  alert oriented following commands   Assessment & Plan:   Acute hypoxic respiratory failure - 2/2 COVID PNA.  S/p course of Remdesivir, currently on day 5/10 Decadron. - Wean FIO2, sat goal >94%  - hopefully down to 15L [place] today off HHFNC if possible - obs for any declining respiratory status in ICU  - continue steroids and course of remdesivir   Term Pregnancy [redacted] wk gestation  - per OB  - possible transfer to L&D tomorrow for induction after discussion with OBGYN   Elevated D-Dimer - LE duplex neg.  CTA restricted due to pregnancy. - continue lovenox .  Transaminitis - 2/2 COVID + Remdesivir. Hyperbilirubinemia - improving, unclear etiology. Abd [place] with cholelithiasis and no evidence of cholecystitis - Trend - seem to be improving.  Hx hydrocephalus - s/p VP shunt placement. - supportive care    Best practice (evaluated daily)  Diet: Regular. Pain/Anxiety/Delirium protocol (if indicated): None. VAP protocol (if indicated): None. DVT prophylaxis: Lovenox / SCD's. GI prophylaxis: None. Glucose control: SSI if glucose consistently > 180. Mobility: Bedrest. Disposition:ICU.  Goals of Care:  Last date of multidisciplinary goals of care discussion:None. Family and staff present: None. Summary of discussion: None. Follow up goals of care discussion due: None. Code Status: Full.  Labs   CBC: Recent Labs  Lab 01/30/20 5366 01/31/20 4403 02/01/20 0617 02/01/20 2306 02/02/20 4742  WBC 11.8* 8.4 10.1 10.2 9.7  NEUTROABS  --   --   --   --  6.3  HGB 13.6 12.0 13.0 13.0 13.2  HCT 41.1 35.0* 37.6 37.1 39.7  MCV 88.2 86.8 85.8 85.5 85.7  PLT 339 285 330 324 443    Basic Metabolic Panel: Recent Labs  Lab 01/27/20 1931 01/28/20 0606 01/29/20 0647 01/30/20 0657 01/31/20 0323 02/01/20 0617 02/01/20 2306 02/02/20 0635  NA  --    < > 138 141 139 138  --  138  K  --    < > 3.6 4.0 3.8 3.6  --  3.8  CL  --    < > 110 114* 115* 115*  --  111  CO2  --    < > 16*  15* 17* 16*  --  15*  GLUCOSE  --    < > 96 101* 99 93  --  105*  BUN  --    < > 8 11 12 14   --  15  CREATININE  --    < > 0.81 0.87 0.65 0.77 0.88 0.86  CALCIUM  --    < > 8.1* 8.2* 7.8* 7.8*  --  8.5*  MG 1.7  --  1.8  --   --   --   --  1.7  PHOS  --   --   --   --   --   --   --  3.7   < > = values in this interval not displayed.   GFR: Estimated Creatinine Clearance: 98.6 mL/min (by C-G formula based on SCr of 0.86 mg/dL). Recent Labs  Lab 01/27/20 1931 01/27/20 2250 01/28/20 0606 01/31/20 0323 02/01/20 0617 02/01/20 2306 02/02/20 0635  PROCALCITON <0.10  --   --   --   --   --   --   WBC  --   --    < > 8.4 10.1 10.2 9.7  LATICACIDVEN 1.9 1.5  --   --   --   --   --    < > = values in this interval not displayed.    Liver Function Tests: Recent Labs  Lab 01/29/20 0647 01/30/20 0657 01/31/20 0323 02/01/20 0617 02/02/20 0635  AST 611* 465* 208* 76* 38  ALT 376* 346* 227* 157* 112*  ALKPHOS 144* 158* 126 148* 156*  BILITOT 2.1* 2.1* 1.4* 1.5* 1.5*  PROT 5.3* 5.7* 4.5* 5.1* 4.8*  ALBUMIN 1.9* 2.1* 1.6* 1.9* 1.9*   No results for input(s): LIPASE, AMYLASE in the last 168 hours. No results for input(s): AMMONIA in the last 168 hours.  ABG No results found for: PHART, PCO2ART, PO2ART, HCO3, TCO2, ACIDBASEDEF, O2SAT   Coagulation Profile: No results for input(s): INR, PROTIME in the last 168 hours.  Cardiac Enzymes: No results for input(s): CKTOTAL, CKMB, CKMBINDEX, TROPONINI in the last 168 hours.  HbA1C: Hgb A1c MFr Bld  Date/Time Value Ref Range Status  12/10/2018 02:23 PM 5.4 4.8 - 5.6 % Final    Comment:             Prediabetes: 5.7 - 6.4          Diabetes: >6.4          Glycemic control for adults with diabetes: <7.0     CBG: Recent Labs  Lab 01/31/20 1223 01/31/20 1836 02/01/20 1214 02/01/20 1717 02/01/20 2027  GLUCAP 84 92 104* 93 124*     This patient is critically ill  with multiple organ system failure; which, requires frequent high  complexity decision making, assessment, support, evaluation, and titration of therapies. This was completed through the application of advanced monitoring technologies and extensive interpretation of multiple databases. During this encounter critical care time was devoted to patient care services described in this note for 31 minutes.   Josephine Igo, DO [facility] 02/02/2020 9:11 AM

## 2020-02-02 NOTE — Consult Note (Signed)
MFM Brief Note  This is a note to affirm the plan to deliver Vicki Mcdowell on 02/03/2020. She is a 35 yo G1P0 at 54 w 1 d with COVID + Pneumonia on NRB and 25 L,. She is  s/p BMZ. We had a multidisciplinary meeting regarding her care today with the final decision to move toward IOL of labor on 1/7 given that she was administered Lovenox 40 u this morning at 10 am.   However, delivery should be expedited if for non-reassuring fetal status.   Plan discussed with Dr. Donavan Foil and team.  Novella Olive, MD.

## 2020-02-02 NOTE — Progress Notes (Signed)
Subjective: Pt discussed in detail during red chart (high risk) rounds.  Due to occasional decelerations and 6/8 BPP for breathing movements, the consensus was for induction starting on 02/03/20.  The preferred route of delivery would be vaginal.  Neurosurgery has cleared her for epidural/spinal, but may need to be notified if the shunt is encountered.  I have spoken to Dr. Valeta Harms and he has no reservations for her to be transferred to L and D for IOL.  She is currently on 25 L of oxygen, but she is in the process of being weaned down.  She received her prophylactic dose of lovenox for today, so the IOL will be started tomorrow and tomorrow's dose will be held.  Objective: Vital signs in last 24 hours: Temp:  [97.4 F (36.3 C)-98.2 F (36.8 C)] 97.7 F (36.5 C) (01/06 1135) Pulse Rate:  [46-102] 102 (01/06 1300) Resp:  [18-33] 26 (01/06 1300) BP: (109-135)/(64-95) 113/73 (01/06 1300) SpO2:  [82 %-98 %] 94 % (01/06 1300) FiO2 (%):  [35 %-45 %] 35 % (01/06 0854) Weight:  [89 kg] 89 kg (01/05 2038) Weight change:   FHT:  Baseline 150s, moderate variability, small accels noted, occasional variable and late appearing decels.  Irregular ctx. Lab Results: Recent Labs    02/01/20 2306 02/02/20 0635  WBC 10.2 9.7  HGB 13.0 13.2  HCT 37.1 39.7  PLT 324 343   BMET:  Recent Labs    02/01/20 0617 02/01/20 2306 02/02/20 0635  NA 138  --  138  K 3.6  --  3.8  CL 115*  --  111  CO2 16*  --  15*  GLUCOSE 93  --  105*  BUN 14  --  15  CREATININE 0.77 0.88 0.86  CALCIUM 7.8*  --  8.5*   Portable chest 1 View  Result Date: 02/02/2020 CLINICAL DATA:  Pregnant, dyspnea EXAM: PORTABLE CHEST 1 VIEW COMPARISON:  01/29/2020 FINDINGS: Multifocal pulmonary infiltrates are seen scattered throughout the lungs, progressive since prior examination, and likely reflecting changes of atypical infection in the acute setting. Pulmonary insufflation is stable, though lung volumes are small. No pneumothorax or  pleural effusion. Cardiac size within normal limits. Ventriculoperitoneal shunt catheter tubing overlies the right hemithorax. No acute bone abnormality. IMPRESSION: Progressive multifocal pulmonary infiltrates, likely infectious in the acute setting. Electronically Signed   By: Fidela Salisbury MD   On: 02/02/2020 05:04   [place] MFM FETAL BPP WO NON STRESS  Result Date: 02/02/2020 ----------------------------------------------------------------------  OBSTETRICS REPORT                       (Signed Final 02/02/2020 01:26 pm) ---------------------------------------------------------------------- Patient Info  ID #:       [id number]                          D.O.B.:  1985/11/06 (35 yrs)  Name:       Vicki Mcdowell                 Visit Date: 02/02/2020 09:51 am ---------------------------------------------------------------------- Performed By  Attending:        Sander Nephew      Secondary Phy.:   Carolann Littler                    MD  BASS MD  Performed By:     Sandi Mealy        [facility]  Referred By:      [facility]           [facility] ---------------------------------------------------------------------- Orders  #  Description                           Code        Ordered By  1  [place] MFM FETAL BPP WO NON               61950.93    Mariel Aloe     STRESS ----------------------------------------------------------------------  #  Order #                     Accession #                Episode #  1  [id number]                   [id number]                 [id number] ---------------------------------------------------------------------- Indications  Non-reactive NST                                O28.9  [redacted] weeks gestation of pregnancy                Z3A.37  Covid positive ---------------------------------------------------------------------- Fetal Evaluation  Num Of Fetuses:         1  Fetal Heart Rate(bpm):  147  Cardiac Activity:       Observed  Presentation:           Cephalic  P. Cord Insertion:      Not well visualized  Amniotic Fluid  AFI FV:      Within normal limits  AFI Sum(cm)     %Tile       Largest Pocket(cm)  19.2            74          6.1  RUQ(cm)  RLQ(cm)       LUQ(cm)        LLQ(cm)  4.1           4.7           4.3            6.1 ---------------------------------------------------------------------- Biophysical Evaluation  Amniotic F.V:   Pocket => 2 cm             F. Tone:        Observed  F. Movement:    Observed                   Score:          6/8  F. Breathing:   Not Observed ---------------------------------------------------------------------- Gestational Age  LMP:           37w 1d        Date:  05/18/19                 EDD:   02/22/20  Best:          37w 1d     Det. By:  LMP  (05/18/19)          EDD:   02/22/20 ---------------------------------------------------------------------- Impression  Antenatal testing due to intermittent Cat 2 tracing.  Biophysical profile 6/8 with good fetal movement and  amnioticentesis ---------------------------------------------------------------------- Recommendations  Continue current plan for monitoring.  Plan for delivery on 1/7 and hold Lovenox. ----------------------------------------------------------------------               Lin Landsman, MD Electronically Signed Final Report   02/02/2020 01:26 pm ----------------------------------------------------------------------   I have reviewed the patient's current medications.  Assessment/Plan: covid pneumonia, Per previous notes, pt will be transferred to L and D for IOL on 02/03/20.   Lovenox has been discontinued. Will reach out to neurosurgery regarding VP shunt in case the  patient needs cesarean section.    LOS: 1 day   Warden Fillers 02/02/2020,2:25 PM

## 2020-02-03 ENCOUNTER — Inpatient Hospital Stay (HOSPITAL_COMMUNITY): Payer: Medicaid Other | Admitting: Anesthesiology

## 2020-02-03 ENCOUNTER — Other Ambulatory Visit: Payer: Self-pay

## 2020-02-03 ENCOUNTER — Encounter (HOSPITAL_COMMUNITY): Payer: Self-pay | Admitting: Pulmonary Disease

## 2020-02-03 ENCOUNTER — Other Ambulatory Visit: Payer: Medicaid Other

## 2020-02-03 DIAGNOSIS — O9852 Other viral diseases complicating childbirth: Secondary | ICD-10-CM

## 2020-02-03 DIAGNOSIS — J1282 Pneumonia due to coronavirus disease 2019: Secondary | ICD-10-CM

## 2020-02-03 DIAGNOSIS — U071 COVID-19: Secondary | ICD-10-CM

## 2020-02-03 DIAGNOSIS — Z3A37 37 weeks gestation of pregnancy: Secondary | ICD-10-CM

## 2020-02-03 DIAGNOSIS — Z982 Presence of cerebrospinal fluid drainage device: Secondary | ICD-10-CM

## 2020-02-03 DIAGNOSIS — O368330 Maternal care for abnormalities of the fetal heart rate or rhythm, third trimester, not applicable or unspecified: Secondary | ICD-10-CM

## 2020-02-03 LAB — CBC WITH DIFFERENTIAL/PLATELET
Abs Immature Granulocytes: 1.13 10*3/uL — ABNORMAL HIGH (ref 0.00–0.07)
Basophils Absolute: 0.1 10*3/uL (ref 0.0–0.1)
Basophils Relative: 1 %
Eosinophils Absolute: 0 10*3/uL (ref 0.0–0.5)
Eosinophils Relative: 0 %
HCT: 37.2 % (ref 36.0–46.0)
Hemoglobin: 12.4 g/dL (ref 12.0–15.0)
Immature Granulocytes: 9 %
Lymphocytes Relative: 14 %
Lymphs Abs: 1.7 10*3/uL (ref 0.7–4.0)
MCH: 28.9 pg (ref 26.0–34.0)
MCHC: 33.3 g/dL (ref 30.0–36.0)
MCV: 86.7 fL (ref 80.0–100.0)
Monocytes Absolute: 0.6 10*3/uL (ref 0.1–1.0)
Monocytes Relative: 5 %
Neutro Abs: 8.7 10*3/uL — ABNORMAL HIGH (ref 1.7–7.7)
Neutrophils Relative %: 71 %
Platelets: 301 10*3/uL (ref 150–400)
RBC: 4.29 MIL/uL (ref 3.87–5.11)
RDW: 13.1 % (ref 11.5–15.5)
WBC: 12.1 10*3/uL — ABNORMAL HIGH (ref 4.0–10.5)
nRBC: 1.6 % — ABNORMAL HIGH (ref 0.0–0.2)

## 2020-02-03 LAB — COMPREHENSIVE METABOLIC PANEL
ALT: 79 U/L — ABNORMAL HIGH (ref 0–44)
AST: 29 U/L (ref 15–41)
Albumin: 1.7 g/dL — ABNORMAL LOW (ref 3.5–5.0)
Alkaline Phosphatase: 142 U/L — ABNORMAL HIGH (ref 38–126)
Anion gap: 10 (ref 5–15)
BUN: 14 mg/dL (ref 6–20)
CO2: 16 mmol/L — ABNORMAL LOW (ref 22–32)
Calcium: 8.2 mg/dL — ABNORMAL LOW (ref 8.9–10.3)
Chloride: 111 mmol/L (ref 98–111)
Creatinine, Ser: 0.84 mg/dL (ref 0.44–1.00)
GFR, Estimated: >60 mL/min (ref >60)
Glucose, Bld: 120 mg/dL — ABNORMAL HIGH (ref 70–99)
Potassium: 3.8 mmol/L (ref 3.5–5.1)
Sodium: 137 mmol/L (ref 135–145)
Total Bilirubin: 1.3 mg/dL — ABNORMAL HIGH (ref 0.3–1.2)
Total Protein: 4.7 g/dL — ABNORMAL LOW (ref 6.5–8.1)

## 2020-02-03 LAB — PROTIME-INR
INR: 1.1 (ref 0.8–1.2)
Prothrombin Time: 13.7 seconds (ref 11.4–15.2)

## 2020-02-03 LAB — PHOSPHORUS: Phosphorus: 2.8 mg/dL (ref 2.5–4.6)

## 2020-02-03 LAB — MAGNESIUM: Magnesium: 1.6 mg/dL — ABNORMAL LOW (ref 1.7–2.4)

## 2020-02-03 LAB — D-DIMER, QUANTITATIVE: D-Dimer, Quant: 4.9 ug/mL-FEU — ABNORMAL HIGH (ref 0.00–0.50)

## 2020-02-03 LAB — APTT: aPTT: 29 seconds (ref 24–36)

## 2020-02-03 MED ORDER — TETANUS-DIPHTH-ACELL PERTUSSIS 5-2.5-18.5 LF-MCG/0.5 IM SUSY: 0.5000 mL | PREFILLED_SYRINGE | Freq: Once | INTRAMUSCULAR | Status: DC

## 2020-02-03 MED ORDER — SENNOSIDES-DOCUSATE SODIUM 8.6-50 MG PO TABS
2.0000 | ORAL_TABLET | ORAL | Status: DC
  Administered 2020-02-04: 2 via ORAL
  Filled 2020-02-03: qty 2

## 2020-02-03 MED ORDER — OXYTOCIN-SODIUM CHLORIDE 30-0.9 UT/500ML-% IV SOLN
INTRAVENOUS | Status: AC
  Filled 2020-02-03: qty 500

## 2020-02-03 MED ORDER — LACTATED RINGERS IV SOLN: 500.0000 mL | Freq: Once | INTRAVENOUS | Status: DC

## 2020-02-03 MED ORDER — IBUPROFEN 600 MG PO TABS
600.0000 mg | ORAL_TABLET | Freq: Four times a day (QID) | ORAL | Status: DC
  Administered 2020-02-04 – 2020-02-05 (×7): 600 mg via ORAL
  Filled 2020-02-03 (×7): qty 1

## 2020-02-03 MED ORDER — FENTANYL CITRATE (PF) 100 MCG/2ML IJ SOLN
50.0000 ug | INTRAMUSCULAR | Status: DC | PRN
  Administered 2020-02-03: 50 ug via INTRAVENOUS
  Filled 2020-02-03: qty 2

## 2020-02-03 MED ORDER — ACETAMINOPHEN 325 MG PO TABS
650.0000 mg | ORAL_TABLET | ORAL | Status: DC | PRN
  Administered 2020-02-04 (×2): 650 mg via ORAL
  Filled 2020-02-03 (×2): qty 2

## 2020-02-03 MED ORDER — WITCH HAZEL-GLYCERIN EX PADS: 1.0000 "application " | MEDICATED_PAD | CUTANEOUS | Status: DC | PRN

## 2020-02-03 MED ORDER — DIBUCAINE (PERIANAL) 1 % EX OINT: 1.0000 "application " | TOPICAL_OINTMENT | CUTANEOUS | Status: DC | PRN

## 2020-02-03 MED ORDER — LIDOCAINE-EPINEPHRINE (PF) 2 %-1:200000 IJ SOLN
INTRAMUSCULAR | Status: DC | PRN
  Administered 2020-02-03: 3 mL via EPIDURAL

## 2020-02-03 MED ORDER — OXYTOCIN-SODIUM CHLORIDE 30-0.9 UT/500ML-% IV SOLN
2.5000 [IU]/h | INTRAVENOUS | Status: DC
  Administered 2020-02-03: 2.5 [IU]/h via INTRAVENOUS

## 2020-02-03 MED ORDER — PRENATAL MULTIVITAMIN CH
1.0000 | ORAL_TABLET | Freq: Every day | ORAL | Status: DC
  Administered 2020-02-04 – 2020-02-05 (×2): 1 via ORAL
  Filled 2020-02-03 (×2): qty 1

## 2020-02-03 MED ORDER — EPHEDRINE 5 MG/ML INJ: 10.0000 mg | INTRAVENOUS | Status: DC | PRN

## 2020-02-03 MED ORDER — MISOPROSTOL 50MCG HALF TABLET
ORAL_TABLET | ORAL | Status: AC
  Filled 2020-02-03: qty 1

## 2020-02-03 MED ORDER — OXYTOCIN-SODIUM CHLORIDE 30-0.9 UT/500ML-% IV SOLN: 1.0000 m[IU]/min | INTRAVENOUS | Status: DC

## 2020-02-03 MED ORDER — MISOPROSTOL 50MCG HALF TABLET
50.0000 ug | ORAL_TABLET | Freq: Once | ORAL | Status: AC
  Administered 2020-02-03: 50 ug via BUCCAL

## 2020-02-03 MED ORDER — ZOLPIDEM TARTRATE 5 MG PO TABS: 5.0000 mg | ORAL_TABLET | Freq: Every evening | ORAL | Status: DC | PRN

## 2020-02-03 MED ORDER — MISOPROSTOL 25 MCG QUARTER TABLET: 25.0000 ug | ORAL_TABLET | ORAL | Status: DC | PRN

## 2020-02-03 MED ORDER — BENZOCAINE-MENTHOL 20-0.5 % EX AERO: 1.0000 "application " | INHALATION_SPRAY | CUTANEOUS | Status: DC | PRN

## 2020-02-03 MED ORDER — IBUPROFEN 600 MG PO TABS: 600.0000 mg | ORAL_TABLET | Freq: Four times a day (QID) | ORAL | Status: DC

## 2020-02-03 MED ORDER — SIMETHICONE 80 MG PO CHEW
80.0000 mg | CHEWABLE_TABLET | ORAL | Status: DC | PRN
  Administered 2020-02-04: 80 mg via ORAL
  Filled 2020-02-03: qty 1

## 2020-02-03 MED ORDER — PHENYLEPHRINE 40 MCG/ML (10ML) SYRINGE FOR IV PUSH (FOR BLOOD PRESSURE SUPPORT)
80.0000 ug | PREFILLED_SYRINGE | INTRAVENOUS | Status: DC | PRN
  Filled 2020-02-03: qty 10

## 2020-02-03 MED ORDER — ONDANSETRON HCL 4 MG/2ML IJ SOLN: 4.0000 mg | INTRAMUSCULAR | Status: DC | PRN

## 2020-02-03 MED ORDER — FERROUS SULFATE 325 (65 FE) MG PO TABS
325.0000 mg | ORAL_TABLET | ORAL | Status: DC
  Administered 2020-02-04: 325 mg via ORAL
  Filled 2020-02-03: qty 1

## 2020-02-03 MED ORDER — DIPHENHYDRAMINE HCL 25 MG PO CAPS: 25.0000 mg | ORAL_CAPSULE | Freq: Four times a day (QID) | ORAL | Status: DC | PRN

## 2020-02-03 MED ORDER — PHENYLEPHRINE 40 MCG/ML (10ML) SYRINGE FOR IV PUSH (FOR BLOOD PRESSURE SUPPORT): 80.0000 ug | PREFILLED_SYRINGE | INTRAVENOUS | Status: DC | PRN

## 2020-02-03 MED ORDER — MEDROXYPROGESTERONE ACETATE 150 MG/ML IM SUSP
150.0000 mg | INTRAMUSCULAR | Status: AC | PRN
  Administered 2020-02-05: 150 mg via INTRAMUSCULAR
  Filled 2020-02-03: qty 1

## 2020-02-03 MED ORDER — MEASLES, MUMPS & RUBELLA VAC IJ SOLR: 0.5000 mL | Freq: Once | INTRAMUSCULAR | Status: DC

## 2020-02-03 MED ORDER — FENTANYL-BUPIVACAINE-NACL 0.5-0.125-0.9 MG/250ML-% EP SOLN
12.0000 mL/h | EPIDURAL | Status: DC | PRN
Start: 2020-02-03 — End: 2020-02-03
  Filled 2020-02-03: qty 250

## 2020-02-03 MED ORDER — DIPHENHYDRAMINE HCL 50 MG/ML IJ SOLN: 12.5000 mg | INTRAMUSCULAR | Status: DC | PRN

## 2020-02-03 MED ORDER — TERBUTALINE SULFATE 1 MG/ML IJ SOLN: 0.2500 mg | Freq: Once | INTRAMUSCULAR | Status: DC | PRN

## 2020-02-03 MED ORDER — SODIUM CHLORIDE (PF) 0.9 % IJ SOLN
INTRAMUSCULAR | Status: DC | PRN
  Administered 2020-02-03: 12 mL/h via EPIDURAL

## 2020-02-03 MED ORDER — ONDANSETRON HCL 4 MG PO TABS: 4.0000 mg | ORAL_TABLET | ORAL | Status: DC | PRN

## 2020-02-03 MED ORDER — COCONUT OIL OIL: 1.0000 "application " | TOPICAL_OIL | Status: DC | PRN

## 2020-02-03 NOTE — Lactation Note (Signed)
This note was copied from a baby's chart. Lactation Consultation Note  Patient Name: Vicki Mcdowell Today's Date: 02/03/2020 Age:35 hours  Desires Lactation Consult marked NO while in L&D.  LC did not attempt L&D visit.     Ruperto Kiernan A Higuera Ancidey 02/03/2020, 9:13 PM

## 2020-02-03 NOTE — Progress Notes (Addendum)
2130: OB rapid response called d/t pt c/o diffuse abd pain rating 8/10. RRRN came to assess pt. Not currently dilated. Also informed her of pts minimal UOP. OB Md called and made aware. LR bolus given by OB RN. Will continue to monitor throughout the night.   0510: OB Rapid response RN called. Pt crying out in pain to L lower abdomen c/o sharp shooting pain 8/10. Waiting to hear from OB Md.   0600: Pt transferred to 2S-17

## 2020-02-03 NOTE — Anesthesia Procedure Notes (Signed)
Epidural Patient location during procedure: OB Start time: 02/03/2020 8:20 AM End time: 02/03/2020 8:35 AM  Staffing Anesthesiologist: Elmer Picker, MD Performed: anesthesiologist   Preanesthetic Checklist Completed: patient identified, IV checked, risks and benefits discussed, monitors and equipment checked, pre-op evaluation and timeout performed  Epidural Patient position: sitting Prep: DuraPrep and site prepped and draped Patient monitoring: continuous pulse ox, blood pressure, heart rate and cardiac monitor Approach: midline Location: L3-L4 Injection technique: LOR air  Needle:  Needle type: Tuohy  Needle gauge: 17 G Needle length: 9 cm Needle insertion depth: 5 cm Catheter type: closed end flexible Catheter size: 19 Gauge Catheter at skin depth: 11 cm Test dose: negative  Assessment Sensory level: T8 Events: blood not aspirated, injection not painful, no injection resistance, no paresthesia and negative IV test  Additional Notes Patient identified. Risks/Benefits/Options discussed with patient including but not limited to bleeding, infection, nerve damage, paralysis, failed block, incomplete pain control, headache, blood pressure changes, nausea, vomiting, reactions to medication both or allergic, itching and postpartum back pain. Confirmed with bedside nurse the patient's most recent platelet count. Confirmed with patient that they are not currently taking any anticoagulation, have any bleeding history or any family history of bleeding disorders. Patient expressed understanding and wished to proceed. All questions were answered. Sterile technique was used throughout the entire procedure. Please see nursing notes for vital signs. Test dose was given through epidural catheter and negative prior to continuing to dose epidural or start infusion. Warning signs of high block given to the patient including shortness of breath, tingling/numbness in hands, complete motor block, or  any concerning symptoms with instructions to call for help. Patient was given instructions on fall risk and not to get out of bed. All questions and concerns addressed with instructions to call with any issues or inadequate analgesia.  Reason for block:procedure for pain

## 2020-02-03 NOTE — Progress Notes (Signed)
Labor and Delivery Strip review Note  Vicki Mcdowell is a 35 y.o. G1P0000 at [redacted]w[redacted]d admitted for Covid pneumonia, now brought up to L&D for IOL for 6/8 BPP in setting of Covid infection.   S: Feels fine   O:  BP (!) 90/45   Pulse (!) 144   Temp (!) 95.8 F (35.4 C)   Resp (!) 32   Wt 89 kg   LMP 05/18/2019   SpO2 96%   BMI 33.68 kg/m  EFM: 150/mod variability/accels present/intermittent late decels   CVE: Dilation: 4 Effacement (%): 80 Station: -1 Presentation: Vertex Exam by:: [name]    A&P: 35 y.o. G1P0000 [redacted]w[redacted]d admitted to [facility] on 12/31 for Covid Pneumonia, transferred to [place] on 1/5 and now brought to L&D for IOL in setting of 6/8 BPP.   #Labor: s/ cytotec x1, s/p FB. Pitocin started 1100. AROM at 1430 for clear fluid.  #Pain: early epidural in place (see below)  #FWB: cat I at this time  #GBS negative   #Covid Pneumonia:   -followed by medicine, s/p remdesivir, continue decadron 6mg  qday.   -due to high risk respiratory status and desire to avoid general anesthesia, early epidural has been placed.   -flutter valve, IS q2hrs while awake   -on 5L [place], wean as able     , MD 2:50 PM

## 2020-02-03 NOTE — Anesthesia Preprocedure Evaluation (Signed)
Anesthesia Evaluation  Patient identified by MRN, date of birth, ID band Patient awake    Reviewed: Allergy & Precautions, NPO status , Patient's Chart, lab work & pertinent test results  Airway Mallampati: II  TM Distance: >3 FB Neck ROM: Full    Dental no notable dental hx.    Pulmonary pneumonia, unresolved,  COVID PNA currently satting high 90s on 6L [place]; has required up to 30L HFNC   Pulmonary exam normal breath sounds clear to auscultation       Cardiovascular negative cardio ROS Normal cardiovascular exam Rhythm:Regular Rate:Normal     Neuro/Psych VP shunt 2/2 hydrocephalus; currently asymptomatic. Ok for epidural/spinal per neurosurgery negative neurological ROS  negative psych ROS   GI/Hepatic negative GI ROS, Neg liver ROS,   Endo/Other  negative endocrine ROS  Renal/GU negative Renal ROS  negative genitourinary   Musculoskeletal negative musculoskeletal ROS (+)   Abdominal   Peds  Hematology negative hematology ROS (+)   Anesthesia Other Findings IOL at [redacted] weeks gestation 2/2 COVID PNA and acute respiratory insufficiency  Reproductive/Obstetrics (+) Pregnancy                             Anesthesia Physical Anesthesia Plan  ASA: III  Anesthesia Plan: Epidural   Post-op Pain Management:    Induction:   PONV Risk Score and Plan: 2 and Treatment may vary due to age or medical condition  Airway Management Planned: Natural Airway  Additional Equipment:   Intra-op Plan:   Post-operative Plan:   Informed Consent: I have reviewed the patients History and Physical, chart, labs and discussed the procedure including the risks, benefits and alternatives for the proposed anesthesia with the patient or authorized representative who has indicated his/her understanding and acceptance.       Plan Discussed with: Anesthesiologist  Anesthesia Plan Comments: (Patient identified.  Risks, benefits, options discussed with patient including but not limited to bleeding, infection, nerve damage, paralysis, failed block, incomplete pain control, headache, blood pressure changes, nausea, vomiting, reactions to medication, itching, and post partum back pain. Confirmed with bedside nurse the patient's most recent platelet count. Confirmed with the patient that they are not taking any anticoagulation, have any bleeding history or any family history of bleeding disorders. Patient expressed understanding and wishes to proceed. All questions were answered. )        Anesthesia Quick Evaluation

## 2020-02-03 NOTE — Progress Notes (Addendum)
Labor and Delivery Strip review Note  Vicki Mcdowell is a 35 y.o. G1P0000 at [redacted]w[redacted]d admitted for Covid pneumonia, now brought up to L&D for IOL for 6/8 BPP in setting of Covid infection.   S: Doing well, not feeling contractions   O:  BP 138/88   Pulse 93   Temp (!) 95.8 F (35.4 C)   Resp (!) 32   Wt 89 kg   LMP 05/18/2019   SpO2 100%   BMI 33.68 kg/m  EFM: 150/mod variability/accels present/no decels   CVE: Dilation: 2 Effacement (%): 80 Station: -1 Presentation: Vertex Exam by:: Fabio Neighbors RN   A&P: 43 y.o. G1P0000 [redacted]w[redacted]d admitted to [facility] on 12/31 for Covid Pneumonia, transferred to [place] on 1/5 and now brought to L&D for IOL in setting of 6/8 BPP.   #Labor: s/ cytotec x1, FB Placed without issue. Will start low dose pitocin.  #Pain: early epidural in place (see below)  #FWB: cat I at this time  #GBS negative   #Covid Pneumonia:   -followed by medicine, s/p remdesivir, continue decadron 6mg  qday.   -due to high risk respiratory status and desire to avoid general anesthesia, early epidural has been placed.   -flutter valve, IS q2hrs while awake   -on 6L [place], wean as able       , MD 11:26 AM

## 2020-02-03 NOTE — Progress Notes (Signed)
[employer] in to speak with patients father and support person re: visitation policy on labor and delivery of a patient with Covid

## 2020-02-03 NOTE — Progress Notes (Signed)
PCCM  Chart reviewed. Oxygen requirement improved to 3L. Primary team started pitocin with plan for delivery.  Assessment Acute hypoxemic respiratory failure secondary to COVID pneumonia  Recommendations: -Agree with primary team to avoid general anesthesia due to high respiratory risk -Continue oxygen support. Wean for SpO2 goal >85% -S/p remdesivir. Continue decadron 6mg  x 10 d  , M.D. [facility] Pulmonary/Critical Care Medicine 02/03/2020 3:35 PM

## 2020-02-03 NOTE — Discharge Summary (Signed)
Postpartum Discharge Summary  Date of Service updated 02/05/20     Patient Name: Vicki Mcdowell DOB: [birth year] MRN: [id number]  Date of admission: 02/01/2020 Delivery date:02/03/2020  Delivering provider: Bluford Main S  Date of discharge: 02/05/2020  Admitting diagnosis: Pneumonia due to COVID-19 virus [U07.1, J12.82] Intrauterine pregnancy: [redacted]w[redacted]d    Secondary diagnosis:  Active Problems:   Supervision of other normal pregnancy, antepartum   Obesity in pregnancy, antepartum, third trimester   VP (ventriculoperitoneal) shunt status   COVID-19 affecting pregnancy in third trimester   Pneumonia due to COVID-19 virus  Additional problems: NA    Discharge diagnosis: Term Pregnancy Delivered                                              Post partum procedures:NA Augmentation: Pitocin Complications: None  Hospital course: Induction of Labor With Vaginal Delivery   35y.o. yo G1P0000 at 310w2dasas admitted to the hospital 02/01/2020 for COVID pneumonia. Due to occasional decelrations and 6/8 BPP for breathing movements, decision was made to proceed with induction of labor on 02/03/19. Pt remained on 5 L of O2 via nasal canula throughout labor and pushed without complication to deliver.  Patient had an uncomplicated labor course as follows: Membrane Rupture Time/Date: 2:30 PM ,02/03/2020   Delivery Method:Vaginal, Spontaneous  Episiotomy: None  Lacerations:  None  Details of delivery can be found in separate delivery note.  Patient had a routine postpartum course. She progressed to ambulating, voiding, tolerating diet and good oral pain control. O2 sat stable on RA. Patient is discharged home 02/05/20.  Newborn Data: Birth date:02/03/2020  Birth time:8:35 PM  Gender:Female  Living status:Living  Apgars:9 ,9  Weight:2750 g   Magnesium Sulfate received: No BMZ received: No Rhophylac:N/A MMR:No T-DaP:Given prenatally Flu: No Transfusion:No  Physical exam  Vitals:   02/04/20 1555  02/04/20 1954 02/05/20 0054 02/05/20 0519  BP: 118/75 137/85 123/81 115/71  Pulse: (!) 59 79 74 75  Resp: _0 Temp: 98 F (36.7 C) 98 F (36.7 C) 98.2 F (36.8 C) 98.1 F (36.7 C)  TempSrc: Oral Oral Oral Oral  SpO2: 97% 98% 96% 97%  Weight:       General: alert Lochia: appropriate Uterine Fundus: firm Incision: Healing well with no significant drainage DVT Evaluation: No evidence of DVT seen on physical exam. Labs: Lab Results  Component Value Date   WBC 11.1 (H) 02/05/2020   HGB 10.8 (L) 02/05/2020   HCT 31.1 (L) 02/05/2020   MCV 85.7 02/05/2020   PLT 237 02/05/2020   CMP Latest Ref Rng & Units 02/05/2020  Glucose 70 - 99 mg/dL 126(H)  BUN 6 - 20 mg/dL 10  Creatinine 0.44 - 1.00 mg/dL 0.73  Sodium 135 - 145 mmol/L 134(L)  Potassium 3.5 - 5.1 mmol/L 3.9  Chloride 98 - 111 mmol/L 108  CO2 22 - 32 mmol/L 17(L)  Calcium 8.9 - 10.3 mg/dL 7.9(L)  Total Protein 6.5 - 8.1 g/dL 4.3(L)  Total Bilirubin 0.3 - 1.2 mg/dL 0.7  Alkaline Phos 38 - 126 U/L 106  AST 15 - 41 U/L 22  ALT 0 - 44 U/L 39   Edinburgh Score: Edinburgh Postnatal Depression Scale Screening Tool 02/03/2020  I have been able to laugh and see the funny side of things. (No Data)  After visit meds:  Allergies as of 02/05/2020   No Known Allergies     Medication List    STOP taking these medications   ondansetron 4 MG disintegrating tablet Commonly known as: Zofran ODT     TAKE these medications   dexamethasone 6 MG tablet Commonly known as: DECADRON Take 1 tablet (6 mg total) by mouth 3 (three) times daily for 6 doses.   ibuprofen 600 MG tablet Commonly known as: ADVIL Take 1 tablet (600 mg total) by mouth every 6 (six) hours as needed for moderate pain.   prenatal multivitamin Tabs tablet Take 2 tablets by mouth daily at 12 noon.       Discharge home in stable condition Infant Feeding: routine diet Infant Disposition:home with mother Discharge instruction: per After Visit  Summary and Postpartum booklet. Activity: Advance as tolerated. Pelvic rest for 6 weeks.  Diet: routine diet Future Appointments:No future appointments. Follow up Visit:  [place]. Schedule an appointment as soon as possible for a visit.   Specialty: Obstetrics and Gynecology Why: 4 weeks for PP visit Call PCP for Covid follow up Contact information: [address] [place] [phone] [place] in [place].  Pt to follow up postpartum after discharge with her prenatal care practice.    Please schedule this patient for a In person postpartum visit in 4 weeks with the following provider: MD. Additional Postpartum F/U: with PCP for Covid  High risk pregnancy complicated by: COVID pneumonia Delivery mode:  Vaginal, Spontaneous  Anticipated Birth Control:  Depo Provera 02/05/20   02/05/2020 Chancy Milroy, MD

## 2020-02-04 ENCOUNTER — Encounter (HOSPITAL_COMMUNITY): Payer: Self-pay | Admitting: Pulmonary Disease

## 2020-02-04 DIAGNOSIS — O9853 Other viral diseases complicating the puerperium: Secondary | ICD-10-CM

## 2020-02-04 DIAGNOSIS — R0902 Hypoxemia: Secondary | ICD-10-CM

## 2020-02-04 LAB — COMPREHENSIVE METABOLIC PANEL
ALT: 53 U/L — ABNORMAL HIGH (ref 0–44)
AST: 28 U/L (ref 15–41)
Albumin: 1.7 g/dL — ABNORMAL LOW (ref 3.5–5.0)
Alkaline Phosphatase: 124 U/L (ref 38–126)
Anion gap: 9 (ref 5–15)
BUN: 13 mg/dL (ref 6–20)
CO2: 18 mmol/L — ABNORMAL LOW (ref 22–32)
Calcium: 7.9 mg/dL — ABNORMAL LOW (ref 8.9–10.3)
Chloride: 110 mmol/L (ref 98–111)
Creatinine, Ser: 0.87 mg/dL (ref 0.44–1.00)
GFR, Estimated: >60 mL/min (ref >60)
Glucose, Bld: 98 mg/dL (ref 70–99)
Potassium: 3.4 mmol/L — ABNORMAL LOW (ref 3.5–5.1)
Sodium: 137 mmol/L (ref 135–145)
Total Bilirubin: 1.1 mg/dL (ref 0.3–1.2)
Total Protein: 4.3 g/dL — ABNORMAL LOW (ref 6.5–8.1)

## 2020-02-04 LAB — PHOSPHORUS: Phosphorus: 2.7 mg/dL (ref 2.5–4.6)

## 2020-02-04 LAB — CBC WITH DIFFERENTIAL/PLATELET
Abs Immature Granulocytes: 1.15 10*3/uL — ABNORMAL HIGH (ref 0.00–0.07)
Basophils Absolute: 0.1 10*3/uL (ref 0.0–0.1)
Basophils Relative: 1 %
Eosinophils Absolute: 0 10*3/uL (ref 0.0–0.5)
Eosinophils Relative: 0 %
HCT: 33.2 % — ABNORMAL LOW (ref 36.0–46.0)
Hemoglobin: 11.2 g/dL — ABNORMAL LOW (ref 12.0–15.0)
Immature Granulocytes: 8 %
Lymphocytes Relative: 14 %
Lymphs Abs: 2.1 10*3/uL (ref 0.7–4.0)
MCH: 28.9 pg (ref 26.0–34.0)
MCHC: 33.7 g/dL (ref 30.0–36.0)
MCV: 85.8 fL (ref 80.0–100.0)
Monocytes Absolute: 1.2 10*3/uL — ABNORMAL HIGH (ref 0.1–1.0)
Monocytes Relative: 8 %
Neutro Abs: 10.9 10*3/uL — ABNORMAL HIGH (ref 1.7–7.7)
Neutrophils Relative %: 69 %
Platelets: 245 10*3/uL (ref 150–400)
RBC: 3.87 MIL/uL (ref 3.87–5.11)
RDW: 13 % (ref 11.5–15.5)
WBC: 15.3 10*3/uL — ABNORMAL HIGH (ref 4.0–10.5)
nRBC: 1.7 % — ABNORMAL HIGH (ref 0.0–0.2)

## 2020-02-04 LAB — MAGNESIUM: Magnesium: 1.6 mg/dL — ABNORMAL LOW (ref 1.7–2.4)

## 2020-02-04 LAB — D-DIMER, QUANTITATIVE: D-Dimer, Quant: 6.71 ug/mL-FEU — ABNORMAL HIGH (ref 0.00–0.50)

## 2020-02-04 MED ORDER — LACTATED RINGERS IV SOLN: INTRAVENOUS | Status: DC

## 2020-02-04 MED ORDER — DIBUCAINE (PERIANAL) 1 % EX OINT: 1.0000 "application " | TOPICAL_OINTMENT | CUTANEOUS | Status: DC | PRN

## 2020-02-04 MED ORDER — BENZOCAINE-MENTHOL 20-0.5 % EX AERO
1.0000 | INHALATION_SPRAY | CUTANEOUS | Status: DC | PRN
Start: 2020-02-03 — End: 2020-02-04

## 2020-02-04 MED ORDER — COCONUT OIL OIL: 1.0000 "application " | TOPICAL_OIL | Status: DC | PRN

## 2020-02-04 MED ORDER — ONDANSETRON HCL 4 MG/2ML IJ SOLN: 4.0000 mg | INTRAMUSCULAR | Status: DC | PRN

## 2020-02-04 MED ORDER — OXYCODONE-ACETAMINOPHEN 5-325 MG PO TABS
1.0000 | ORAL_TABLET | Freq: Four times a day (QID) | ORAL | Status: DC | PRN
  Administered 2020-02-04: 1 via ORAL
  Filled 2020-02-04: qty 1

## 2020-02-04 MED ORDER — IBUPROFEN 600 MG PO TABS: 600.0000 mg | ORAL_TABLET | Freq: Four times a day (QID) | ORAL | Status: DC | PRN

## 2020-02-04 MED ORDER — DIPHENHYDRAMINE HCL 25 MG PO CAPS: 25.0000 mg | ORAL_CAPSULE | Freq: Four times a day (QID) | ORAL | Status: DC | PRN

## 2020-02-04 MED ORDER — SENNOSIDES-DOCUSATE SODIUM 8.6-50 MG PO TABS: 2.0000 | ORAL_TABLET | Freq: Every day | ORAL | Status: DC

## 2020-02-04 MED ORDER — ENOXAPARIN SODIUM 40 MG/0.4ML ~~LOC~~ SOLN
40.0000 mg | SUBCUTANEOUS | Status: DC
  Administered 2020-02-04: 40 mg via SUBCUTANEOUS
  Filled 2020-02-04: qty 0.4

## 2020-02-04 MED ORDER — ACETAMINOPHEN 325 MG PO TABS: 650.0000 mg | ORAL_TABLET | ORAL | Status: DC | PRN

## 2020-02-04 MED ORDER — WITCH HAZEL-GLYCERIN EX PADS: 1.0000 "application " | MEDICATED_PAD | CUTANEOUS | Status: DC | PRN

## 2020-02-04 MED ORDER — ZOLPIDEM TARTRATE 5 MG PO TABS: 5.0000 mg | ORAL_TABLET | Freq: Every evening | ORAL | Status: DC | PRN

## 2020-02-04 MED ORDER — TETANUS-DIPHTH-ACELL PERTUSSIS 5-2.5-18.5 LF-MCG/0.5 IM SUSY: 0.5000 mL | PREFILLED_SYRINGE | Freq: Once | INTRAMUSCULAR | Status: DC

## 2020-02-04 MED ORDER — OXYCODONE HCL 5 MG PO TABS
5.0000 mg | ORAL_TABLET | Freq: Once | ORAL | Status: AC
  Administered 2020-02-04: 5 mg via ORAL
  Filled 2020-02-04: qty 1

## 2020-02-04 MED ORDER — ONDANSETRON HCL 4 MG PO TABS: 4.0000 mg | ORAL_TABLET | ORAL | Status: DC | PRN

## 2020-02-04 MED ORDER — SIMETHICONE 80 MG PO CHEW: 80.0000 mg | CHEWABLE_TABLET | ORAL | Status: DC | PRN

## 2020-02-04 MED ORDER — PRENATAL MULTIVITAMIN CH: 1.0000 | ORAL_TABLET | Freq: Every day | ORAL | Status: DC

## 2020-02-04 MED ORDER — IBUPROFEN 600 MG PO TABS: 600.0000 mg | ORAL_TABLET | Freq: Four times a day (QID) | ORAL | Status: DC

## 2020-02-04 NOTE — Anesthesia Postprocedure Evaluation (Signed)
Anesthesia Post Note  Patient: Vicki Mcdowell  Procedure(s) Performed: AN AD HOC LABOR EPIDURAL     Patient location during evaluation: Mother Baby Anesthesia Type: Epidural Level of consciousness: awake and alert and oriented Pain management: satisfactory to patient Vital Signs Assessment: post-procedure vital signs reviewed and stable Respiratory status: respiratory function stable Cardiovascular status: stable Postop Assessment: no headache, no backache, epidural receding, patient able to bend at knees, no signs of nausea or vomiting, adequate PO intake and able to ambulate Anesthetic complications: no   No complications documented.  Last Vitals:  Vitals:   02/04/20 0000 02/04/20 0345  BP: 132/65 (!) 143/78  Pulse: 69   Resp: 20 20  Temp: 36.8 C 37.3 C  SpO2: 100% 99%    Last Pain:  Vitals:   02/04/20 0630  TempSrc:   PainSc: 0-No pain   Pain Goal: Patients Stated Pain Goal: 0 (02/03/20 0400)                 Tuan Tippin

## 2020-02-04 NOTE — Progress Notes (Signed)
RN assisted pt to [facility]. Pt did well, showed SOB with exertion. 2 per assist. RN will continue to monitor.  Herbert Moors, RN

## 2020-02-04 NOTE — Progress Notes (Signed)
Patient on central telemetry. Telemetry box 01, verified with Tammy Pulliam. I agree with central telemetry data regarding ECG recording.

## 2020-02-04 NOTE — Progress Notes (Signed)
Post Partum Day 1 Subjective: Ms Vicki Mcdowell is sitting up in a chair on RA. Denies SOB with ambulation. Tolerating diet, up to restroom, pain controlled  Objective: Blood pressure 131/71, pulse 79, temperature 98 F (36.7 C), temperature source Oral, resp. rate 20, weight 89 kg, last menstrual period 05/18/2019, SpO2 98 %, unknown if currently breastfeeding.  Physical Exam:  General: alert Lochia: appropriate Uterine Fundus: firm Incision: healing well DVT Evaluation: No evidence of DVT seen on physical exam.  Recent Labs    02/03/20 0223 02/04/20 0607  HGB 12.4 11.2*  HCT 37.2 33.2*    Assessment/Plan: PPD # 1 SVD at 37 2/7 weeks Covid   Doing well from an OB. On RA without hypoxia. Appreciate hospitalitis help. They have signed off. OK to D/C home from their standpoint. Plan discharge home tomorrow if remains stable.   LOS: 3 days   Hermina Staggers 02/04/2020, 2:26 PM

## 2020-02-04 NOTE — Progress Notes (Signed)
NAME:  Vicki Mcdowell, MRN:  [id number], DOB:  01-19-1986, LOS: 3 ADMISSION DATE:  02/01/2020, CONSULTATION DATE:  02/01/20 REFERRING MD:  Jean Rosenthal CHIEF COMPLAINT:  Dyspnea   Brief History:  Vicki Mcdowell is a 35 y.o. F G1P0000 at [redacted]w[redacted]d by LMP, admitted to [facility] 12/31 with COVID PNA. Transferred to [facility] ICU 1/5 due to increased O2 requirements.  History of Present Illness:  Vicki Mcdowell is a 35 y.o. F G1P0000 at [redacted]w[redacted]d by LMP.  She was admitted to [facility] 12/31 with dyspnea x 2 days 2/2 COVID PNA.  Her whole household had been infected prior to her being symptomatic.  She was unvaccinated.  She was initially on room air; however, began to have new O2 requirement of 2L via [place] on 1/1.  She was started on Remdesivir and Decadron.  On 1/4, she had desaturation with proning and persisted after back supine.  O2 increased to HHFNC.  By 1/5, she was up to 15L with any exertion.  Due to increasing O2 requirements, it was felt that she should be transferred to [facility] ICU for further management.  Past Medical History:  Hydrocephalus s/p VP shunt.  Significant Hospital Events:  12/31 > admit. 1/5 > transfer to [facility] ICU.  Consults:  PCCM.  Procedures:  Vaginal delivery 1/7  Significant Diagnostic Tests:  Abd [place] 12/31 > cholelithiasis, no cholecystitis. LE Duplex 1/2 > neg.  Micro Data:  None.  Antimicrobials:  None.   Interim History / Subjective:  No acute issues overnight, states she feels well with no complaints   Objective   Blood pressure 133/75, pulse 69, temperature 98.4 F (36.9 C), resp. rate 20, weight 89 kg, last menstrual period 05/18/2019, SpO2 99 %, unknown if currently breastfeeding.        Intake/Output Summary (Last 24 hours) at 02/04/2020 1117 Last data filed at 02/04/2020 0931 Gross per 24 hour  Intake 3 ml  Output 775 ml  Net -772 ml   Filed Weights   02/01/20 2038  Weight: 89 kg    Examination: General: Well developed adult female sitting up in bed in no acute  distress  HEENT: [place]/AT, MM pink/moist, PERRL,  Neuro: Alert and oriented x3, non-focal  CV: s1s2 regular rate and rhythm, no murmur, rubs, or gallops,  PULM:  Clear to ascultation bilaterally, no added breath sounds, oxygen saturations 97% on RA GI: soft, bowel sounds active in all 4 quadrants, non-tender, non-distended Extremities: warm/dry, no edema  Skin: no rashes or lesions  Resolved problems :  Transaminitis  Hyperbilirubinemia   Assessment & Plan:   Acute hypoxic respiratory failure, resolving  - 2/2 COVID PNA.  S/p course of Remdesivir P: On 4/5 days of decadron  Currently saturating 97% on RA  Spo2 goal >90 Encouraged ambulation to assess for developing dyspnea with ambulation  Encourage use of IS and flutter valve   Term Pregnancy  -[redacted] wk gestation  P: Per OB  Vaginal delivery 1/7  Elevated D-Dimer  - LE duplex neg.  CTA restricted due to pregnancy P: S/P Lovenox   Hx hydrocephalus  - s/p VP shunt placement. P:  Supportive care    If patient remains stable on RA with no furhter hypoxia and is able to ambulate without developing dyspnea or hypoxia she is cleared to discharge per PCCM   PCCM will sign off. Thank you for the opportunity to participate in this patient's care. Please contact if we can be of further assistance.   Best practice (  evaluated daily)  Diet: Regular. Pain/Anxiety/Delirium protocol (if indicated): None. VAP protocol (if indicated): None. DVT prophylaxis: Lovenox / SCD's. GI prophylaxis: None. Glucose control: SSI if glucose consistently > 180. Mobility: Bedrest. Disposition:ICU.  Labs   CBC: Recent Labs  Lab 02/01/20 0617 02/01/20 2306 02/02/20 0635 02/03/20 0223 02/04/20 0607  WBC 10.1 10.2 9.7 12.1* 15.3*  NEUTROABS  --   --  6.3 8.7* 10.9*  HGB 13.0 13.0 13.2 12.4 11.2*  HCT 37.6 37.1 39.7 37.2 33.2*  MCV 85.8 85.5 85.7 86.7 85.8  PLT 330 324 343 301 245    Basic Metabolic Panel: Recent Labs  Lab  01/29/20 0647 01/30/20 0657 01/31/20 0323 02/01/20 0617 02/01/20 2306 02/02/20 0635 02/03/20 0223 02/04/20 0607  NA 138   < > 139 138  --  138 137 137  K 3.6   < > 3.8 3.6  --  3.8 3.8 3.4*  CL 110   < > 115* 115*  --  111 111 110  CO2 16*   < > 17* 16*  --  15* 16* 18*  GLUCOSE 96   < > 99 93  --  105* 120* 98  BUN 8   < > 12 14  --  15 14 13   CREATININE 0.81   < > 0.65 0.77 0.88 0.86 0.84 0.87  CALCIUM 8.1*   < > 7.8* 7.8*  --  8.5* 8.2* 7.9*  MG 1.8  --   --   --   --  1.7 1.6* 1.6*  PHOS  --   --   --   --   --  3.7 2.8 2.7   < > = values in this interval not displayed.   GFR: Estimated Creatinine Clearance: 97.5 mL/min (by C-G formula based on SCr of 0.87 mg/dL). Recent Labs  Lab 02/01/20 2306 02/02/20 0635 02/03/20 0223 02/04/20 0607  WBC 10.2 9.7 12.1* 15.3*    Liver Function Tests: Recent Labs  Lab 01/31/20 0323 02/01/20 0617 02/02/20 0635 02/03/20 0223 02/04/20 0607  AST 208* 76* 38 29 28  ALT 227* 157* 112* 79* 53*  ALKPHOS 126 148* 156* 142* 124  BILITOT 1.4* 1.5* 1.5* 1.3* 1.1  PROT 4.5* 5.1* 4.8* 4.7* 4.3*  ALBUMIN 1.6* 1.9* 1.9* 1.7* 1.7*   No results for input(s): LIPASE, AMYLASE in the last 168 hours. No results for input(s): AMMONIA in the last 168 hours.  ABG No results found for: PHART, PCO2ART, PO2ART, HCO3, TCO2, ACIDBASEDEF, O2SAT   Coagulation Profile: Recent Labs  Lab 02/03/20 0223  INR 1.1    Cardiac Enzymes: No results for input(s): CKTOTAL, CKMB, CKMBINDEX, TROPONINI in the last 168 hours.  HbA1C: Hgb A1c MFr Bld  Date/Time Value Ref Range Status  12/10/2018 02:23 PM 5.4 4.8 - 5.6 % Final    Comment:             Prediabetes: 5.7 - 6.4          Diabetes: >6.4          Glycemic control for adults with diabetes: <7.0     CBG: Recent Labs  Lab 01/31/20 1223 01/31/20 1836 02/01/20 1214 02/01/20 1717 02/01/20 2027  GLUCAP 84 92 104* 93 124*    Signature  2028, NP-C  Pulmonary & Critical  Care Contact / Pager information can be found on Amion  02/04/2020, 11:55 AM

## 2020-02-05 ENCOUNTER — Other Ambulatory Visit: Payer: Self-pay | Admitting: Obstetrics and Gynecology

## 2020-02-05 DIAGNOSIS — O99215 Obesity complicating the puerperium: Secondary | ICD-10-CM

## 2020-02-05 DIAGNOSIS — E669 Obesity, unspecified: Secondary | ICD-10-CM

## 2020-02-05 LAB — CBC WITH DIFFERENTIAL/PLATELET
Abs Immature Granulocytes: 0.62 10*3/uL — ABNORMAL HIGH (ref 0.00–0.07)
Basophils Absolute: 0.1 10*3/uL (ref 0.0–0.1)
Basophils Relative: 0 %
Eosinophils Absolute: 0 10*3/uL (ref 0.0–0.5)
Eosinophils Relative: 0 %
HCT: 31.1 % — ABNORMAL LOW (ref 36.0–46.0)
Hemoglobin: 10.8 g/dL — ABNORMAL LOW (ref 12.0–15.0)
Immature Granulocytes: 6 %
Lymphocytes Relative: 10 %
Lymphs Abs: 1.1 10*3/uL (ref 0.7–4.0)
MCH: 29.8 pg (ref 26.0–34.0)
MCHC: 34.7 g/dL (ref 30.0–36.0)
MCV: 85.7 fL (ref 80.0–100.0)
Monocytes Absolute: 0.5 10*3/uL (ref 0.1–1.0)
Monocytes Relative: 5 %
Neutro Abs: 8.9 10*3/uL — ABNORMAL HIGH (ref 1.7–7.7)
Neutrophils Relative %: 79 %
Platelets: 237 10*3/uL (ref 150–400)
RBC: 3.63 MIL/uL — ABNORMAL LOW (ref 3.87–5.11)
RDW: 13.2 % (ref 11.5–15.5)
WBC: 11.1 10*3/uL — ABNORMAL HIGH (ref 4.0–10.5)
nRBC: 0.3 % — ABNORMAL HIGH (ref 0.0–0.2)

## 2020-02-05 LAB — COMPREHENSIVE METABOLIC PANEL
ALT: 39 U/L (ref 0–44)
AST: 22 U/L (ref 15–41)
Albumin: 1.7 g/dL — ABNORMAL LOW (ref 3.5–5.0)
Alkaline Phosphatase: 106 U/L (ref 38–126)
Anion gap: 9 (ref 5–15)
BUN: 10 mg/dL (ref 6–20)
CO2: 17 mmol/L — ABNORMAL LOW (ref 22–32)
Calcium: 7.9 mg/dL — ABNORMAL LOW (ref 8.9–10.3)
Chloride: 108 mmol/L (ref 98–111)
Creatinine, Ser: 0.73 mg/dL (ref 0.44–1.00)
GFR, Estimated: >60 mL/min (ref >60)
Glucose, Bld: 126 mg/dL — ABNORMAL HIGH (ref 70–99)
Potassium: 3.9 mmol/L (ref 3.5–5.1)
Sodium: 134 mmol/L — ABNORMAL LOW (ref 135–145)
Total Bilirubin: 0.7 mg/dL (ref 0.3–1.2)
Total Protein: 4.3 g/dL — ABNORMAL LOW (ref 6.5–8.1)

## 2020-02-05 LAB — D-DIMER, QUANTITATIVE: D-Dimer, Quant: 3.9 ug/mL-FEU — ABNORMAL HIGH (ref 0.00–0.50)

## 2020-02-05 LAB — PHOSPHORUS: Phosphorus: 2.9 mg/dL (ref 2.5–4.6)

## 2020-02-05 LAB — MAGNESIUM: Magnesium: 1.5 mg/dL — ABNORMAL LOW (ref 1.7–2.4)

## 2020-02-05 MED ORDER — DEXAMETHASONE 6 MG PO TABS: 6.0000 mg | ORAL_TABLET | Freq: Every day | ORAL | 0 refills | Status: AC

## 2020-02-05 MED ORDER — DEXAMETHASONE 6 MG PO TABS: 6.0000 mg | ORAL_TABLET | Freq: Three times a day (TID) | ORAL | 0 refills | Status: DC

## 2020-02-05 MED ORDER — IBUPROFEN 600 MG PO TABS: 600.0000 mg | ORAL_TABLET | Freq: Four times a day (QID) | ORAL | 0 refills | Status: DC | PRN

## 2020-02-05 NOTE — Progress Notes (Signed)
Patient to be discharged. Patient was provided discharge instructions, medication instructions. Verbalized understanding. Patient was given information regarding MMR vaccine. Did not receive in hospital due to administration of steroids.

## 2020-02-05 NOTE — Discharge Instructions (Signed)
10 Things You Can Do to Manage Your COVID-19 Symptoms at Home If you have possible or confirmed COVID-19: 1. Stay home from work and school. And stay away from other public places. If you must go out, avoid using any kind of public transportation, ridesharing, or taxis. 2. Monitor your symptoms carefully. If your symptoms get worse, call your healthcare provider immediately. 3. Get rest and stay hydrated. 4. If you have a medical appointment, call the healthcare provider ahead of time and tell them that you have or may have COVID-19. 5. For medical emergencies, call 911 and notify the dispatch personnel that you have or may have COVID-19. 6. Cover your cough and sneezes with a tissue or use the inside of your elbow. 7. Wash your hands often with soap and water for at least 20 seconds or clean your hands with an alcohol-based hand sanitizer that contains at least 60% alcohol. 8. As much as possible, stay in a specific room and away from other people in your home. Also, you should use a separate bathroom, if available. If you need to be around other people in or outside of the home, wear a mask. 9. Avoid sharing personal items with other people in your household, like dishes, towels, and bedding. 10. Clean all surfaces that are touched often, like counters, tabletops, and doorknobs. Use household cleaning sprays or wipes according to the label instructions. cdc.gov/coronavirus 07/28/2018 This information is not intended to replace advice given to you by your health care provider. Make sure you discuss any questions you have with your health care provider. Document Revised: 12/30/2018 Document Reviewed: 12/30/2018 Elsevier Patient Education  2020 Elsevier Inc.  Postpartum Care After Vaginal Delivery This sheet gives you information about how to care for yourself from the time you deliver your baby to up to 6-12 weeks after delivery (postpartum period). Your health care provider may also give you  more specific instructions. If you have problems or questions, contact your health care provider. Follow these instructions at home: Vaginal bleeding  It is normal to have vaginal bleeding (lochia) after delivery. Wear a sanitary pad for vaginal bleeding and discharge. ? During the first week after delivery, the amount and appearance of lochia is often similar to a menstrual period. ? Over the next few weeks, it will gradually decrease to a dry, yellow-brown discharge. ? For most women, lochia stops completely by 4-6 weeks after delivery. Vaginal bleeding can vary from woman to woman.  Change your sanitary pads frequently. Watch for any changes in your flow, such as: ? A sudden increase in volume. ? A change in color. ? Large blood clots.  If you pass a blood clot from your vagina, save it and call your health care provider to discuss. Do not flush blood clots down the toilet before talking with your health care provider.  Do not use tampons or douches until your health care provider says this is safe.  If you are not breastfeeding, your period should return 6-8 weeks after delivery. If you are feeding your child breast milk only (exclusive breastfeeding), your period may not return until you stop breastfeeding. Perineal care  Keep the area between the vagina and the anus (perineum) clean and dry as told by your health care provider. Use medicated pads and pain-relieving sprays and creams as directed.  If you had a cut in the perineum (episiotomy) or a tear in the vagina, check the area for signs of infection until you are healed. Check for: ?   redness, swelling, or pain. ? Fluid or blood coming from the cut or tear. ? Warmth. ? Pus or a bad smell.  You may be given a squirt bottle to use instead of wiping to clean the perineum area after you go to the bathroom. As you start healing, you may use the squirt bottle before wiping yourself. Make sure to wipe gently.  To relieve pain  caused by an episiotomy, a tear in the vagina, or swollen veins in the anus (hemorrhoids), try taking a warm sitz bath 2-3 times a day. A sitz bath is a warm water bath that is taken while you are sitting down. The water should only come up to your hips and should cover your buttocks. Breast care  Within the first few days after delivery, your breasts may feel heavy, full, and uncomfortable (breast engorgement). Milk may also leak from your breasts. Your health care provider can suggest ways to help relieve the discomfort. Breast engorgement should go away within a few days.  If you are breastfeeding: ? Wear a bra that supports your breasts and fits you well. ? Keep your nipples clean and dry. Apply creams and ointments as told by your health care provider. ? You may need to use breast pads to absorb milk that leaks from your breasts. ? You may have uterine contractions every time you breastfeed for up to several weeks after delivery. Uterine contractions help your uterus return to its normal size. ? If you have any problems with breastfeeding, work with your health care provider or Advertising copywriter.  If you are not breastfeeding: ? Avoid touching your breasts a lot. Doing this can make your breasts produce more milk. ? Wear a good-fitting bra and use cold packs to help with swelling. ? Do not squeeze out (express) milk. This causes you to make more milk. Intimacy and sexuality  Ask your health care provider when you can engage in sexual activity. This may depend on: ? Your risk of infection. ? How fast you are healing. ? Your comfort and desire to engage in sexual activity.  You are able to get pregnant after delivery, even if you have not had your period. If desired, talk with your health care provider about methods of birth control (contraception). Medicines  Take over-the-counter and prescription medicines only as told by your health care provider.  If you were prescribed an  antibiotic medicine, take it as told by your health care provider. Do not stop taking the antibiotic even if you start to feel better. Activity  Gradually return to your normal activities as told by your health care provider. Ask your health care provider what activities are safe for you.  Rest as much as possible. Try to rest or take a nap while your baby is sleeping. Eating and drinking   Drink enough fluid to keep your urine pale yellow.  Eat high-fiber foods every day. These may help prevent or relieve constipation. High-fiber foods include: ? Whole grain cereals and breads. ? Brown rice. ? Beans. ? Fresh fruits and vegetables.  Do not try to lose weight quickly by cutting back on calories.  Take your prenatal vitamins until your postpartum checkup or until your health care provider tells you it is okay to stop. Lifestyle  Do not use any products that contain nicotine or tobacco, such as cigarettes and e-cigarettes. If you need help quitting, ask your health care provider.  Do not drink alcohol, especially if you are breastfeeding. General instructions  Keep all follow-up visits for you and your baby as told by your health care provider. Most women visit their health care provider for a postpartum checkup within the first 3-6 weeks after delivery. Contact a health care provider if:  You feel unable to cope with the changes that your child brings to your life, and these feelings do not go away.  You feel unusually sad or worried.  Your breasts become red, painful, or hard.  You have a fever.  You have trouble holding urine or keeping urine from leaking.  You have little or no interest in activities you used to enjoy.  You have not breastfed at all and you have not had a menstrual period for 12 weeks after delivery.  You have stopped breastfeeding and you have not had a menstrual period for 12 weeks after you stopped breastfeeding.  You have questions about caring for  yourself or your baby.  You pass a blood clot from your vagina. Get help right away if:  You have chest pain.  You have difficulty breathing.  You have sudden, severe leg pain.  You have severe pain or cramping in your lower abdomen.  You bleed from your vagina so much that you fill more than one sanitary pad in one hour. Bleeding should not be heavier than your heaviest period.  You develop a severe headache.  You faint.  You have blurred vision or spots in your vision.  You have bad-smelling vaginal discharge.  You have thoughts about hurting yourself or your baby. If you ever feel like you may hurt yourself or others, or have thoughts about taking your own life, get help right away. You can go to the nearest emergency department or call:  Your local emergency services (911 in the [place]  A suicide crisis helpline, such as the National Suicide Prevention Lifeline at 1-800-273-8255. This is open 24 hours a day. Summary  The period of time right after you deliver your newborn up to 6-12 weeks after delivery is called the postpartum period.  Gradually return to your normal activities as told by your health care provider.  Keep all follow-up visits for you and your baby as told by your health care provider. This information is not intended to replace advice given to you by your health care provider. Make sure you discuss any questions you have with your health care provider. Document Revised: 01/16/2017 Document Reviewed: 10/27/2016 Elsevier Patient Education  2020 Elsevier Inc.  

## 2020-02-05 NOTE — Progress Notes (Signed)
OB Note Pharmacy wondering about po decadron at discharge. Per hospitalist note yesterday, she only needed a five day course and today is day five and she is due for that dose qhs today. Pharmacist says conversion is 1:1 so decadron 6mg  po qhs x 1 sent to [facility] pharmacy in [place] and voicemail left for them. Patient also called at [phone] and told her about the changes.   [phone] MD [facility]ending [facility] (Faculty Practice) 02/05/2020 Time: [phone]

## 2020-02-07 ENCOUNTER — Telehealth: Payer: Self-pay | Admitting: Obstetrics and Gynecology

## 2020-02-07 NOTE — Telephone Encounter (Signed)
Pt called stating her feet are extremely swollen. Delivered 02/03/20, sx started 02/04/20. Has been drinking fluids and elevating feet above level of her heart without sx improvement. No change since sx start. Feet feel like they will "bust". No calf pain, no lesions on feet. No UTI sx.  Pt needs to sched 6 wk PP appt too.

## 2020-02-08 ENCOUNTER — Encounter: Payer: Self-pay | Admitting: Obstetrics and Gynecology

## 2020-02-08 ENCOUNTER — Ambulatory Visit (INDEPENDENT_AMBULATORY_CARE_PROVIDER_SITE_OTHER): Payer: Medicaid Other | Admitting: Obstetrics and Gynecology

## 2020-02-08 ENCOUNTER — Other Ambulatory Visit: Payer: Self-pay

## 2020-02-08 VITALS — BP 138/78 | HR 94 | Ht 64.0 in | Wt 205.0 lb

## 2020-02-08 DIAGNOSIS — R609 Edema, unspecified: Secondary | ICD-10-CM | POA: Insufficient documentation

## 2020-02-08 LAB — POCT URINALYSIS DIPSTICK
Appearance: ABNORMAL
Bilirubin, UA: NEGATIVE
Glucose, UA: NEGATIVE
Ketones, UA: NEGATIVE
Nitrite, UA: NEGATIVE
Odor: NORMAL
Protein, UA: POSITIVE — AB
Spec Grav, UA: 1.02 (ref 1.010–1.025)
Urobilinogen, UA: 0.2 E.U./dL
pH, UA: 7.5 (ref 5.0–8.0)

## 2020-02-08 NOTE — Telephone Encounter (Signed)
Pt calling back. Feet are still severely swollen.

## 2020-02-08 NOTE — Progress Notes (Signed)
Patient ID: EVEREST HACKING, female   DOB: July 10, 1985, 35 y.o.   MRN: [id number]  Reason for Consult: Postpartum Care (Extremely swollen feet/painful)   Referred by Hamrick, Durward Fortes, MD  Subjective:     HPI:  Vicki Mcdowell is a 35 y.o. female presents for concerns for bilateral lower extremity swelling. Patient states her feet feel so tight they "might explode." Patient reports swelling since Sunday. Patient denies SOB. Patient denies headache or vision changes. Patient states she has had intermittent RUQ pain since hospitalization and pregnancy, over the course of the last few weeks.   Obstetrical History G1P1001 Patient with SVD on 02/03/20 following IOL, hospitalization for COVID-19 pneumonia requiring supplemental oxygen.   Past Medical History:  Diagnosis Date  . VP (ventriculoperitoneal) shunt status    No family history on file. Past Surgical History:  Procedure Laterality Date  . DG HYSTEROGRAM (HSG)  10/28/2018      . hydroceph    . SHUNT REVISION VENTRICULAR-PERITONEAL      Short Social History:  Social History   Tobacco Use  . Smoking status: Never Smoker  . Smokeless tobacco: Never Used  Substance Use Topics  . Alcohol use: Never    No Known Allergies  Current Outpatient Medications  Medication Sig Dispense Refill  . ibuprofen (ADVIL) 600 MG tablet Take 1 tablet (600 mg total) by mouth every 6 (six) hours as needed for moderate pain. 30 tablet 0  . Prenatal Vit-Fe Fumarate-FA (PRENATAL MULTIVITAMIN) TABS tablet Take 2 tablets by mouth daily at 12 noon.     No current facility-administered medications for this visit.    Review of Systems  Constitutional: Positive for fatigue. Negative for chills, diaphoresis and fever.  HENT: HENT negative.  Eyes: Eyes negative.  Respiratory: Positive for shortness of breath. Negative for cough and wheezing.       SOB with exertion Cardiovascular: Positive for leg swelling. Negative for chest pain, chest tightness,  dyspnea with exertion and orthopnea.  GI: Positive for abdominal pain.       Occasional RUQ pain GU: Positive for hematuria.  Musculoskeletal: Musculoskeletal negative.  Skin: Skin negative.  Neurological: Neurological negative. Hematologic: Hematologic/lymphatic negative.  Psychiatric: Psychiatric negative.        Objective:  Objective   Vitals:   02/08/20 1129  BP: 138/78  Pulse: 94  SpO2: 96%  Weight: 205 lb (93 kg)  Height: 5\' 4"  (1.626 m)   Body mass index is 35.19 kg/m.  Physical Exam Constitutional:      General: She is not in acute distress.    Appearance: Normal appearance.  HENT:     Head: Normocephalic.  Cardiovascular:     Rate and Rhythm: Regular rhythm. Tachycardia present.     Heart sounds: Normal heart sounds.  Pulmonary:     Comments: Increased WOB noted on exam, patient with RR 20-28 throughout appointment. Lung sounds diminished bilaterally. Abdominal:     Palpations: Abdomen is soft.     Tenderness: There is no abdominal tenderness.  Musculoskeletal:        General: Normal range of motion.     Right lower leg: Edema present.     Left lower leg: Edema present.     Comments: 3+ pitting edema in bilateral LEs.  Neurological:     Mental Status: She is alert.  Psychiatric:        Mood and Affect: Mood normal.        Behavior: Behavior normal.  Assessment/Plan:    35 yo G1P1001, postpartum day 5, with recent discharge from hospital for COVID-19 pneumonia with severe disease presentation and IOL with SVD. -Patient with new onset leg swelling - BP mildly elevated at today's visit. Increased WOB noted on exam. Patient O2 saturation 95-98% on room air. HR to 120s with exertion (walking in office). -Reviewed findings with Dr. Jerene Pitch -Recommendation made for patient to present to ER for further evaluation and work-up (r/o postpartum preeclampsia, postpartum cardiomyopathy given patient's complex recent hospitalization for COVID pneumonia in  context of pregnancy) -Patient resistant to recommendation to present for evaluation citing significant concerns regarding childcare, mother is currently hospitalized for COVID-19 -Dr. Jerene Pitch present for in-depth discussion with patient regarding life-threatening risks of untreated blood pressure disorders or cardiac issues in the postpartum period -Patient stated understanding and said she would present to ER for evaluation later today after arranging childcare     Zipporah Plants, CNM [facility], [facility] 02/08/2020 4:52 PM

## 2020-02-08 NOTE — Telephone Encounter (Signed)
Spoke w/patient. She denies headache blurred vision. Feet are just very swollen and tight/painful. Scheduled apt for evaluation today at 11:30 w/[facility]

## 2020-02-09 ENCOUNTER — Telehealth: Payer: Self-pay | Admitting: Obstetrics and Gynecology

## 2020-02-09 NOTE — Telephone Encounter (Signed)
Called patient to follow up from yesterday's office visit. She did not answer. After speaking with Gala Lewandowsky regarding patient's clinical picture and desire to avoid the ER the consensus was for her to be seen in the [facility].  Left Kimbella a message regarding being evaluated in the MAU at [facility]. I encouraged he to seek care there to avoid an ER wait.   When I spoke with her in the office yesterday during her visit with Jobie Quaker CNM I had expressed my concerns regarding possible preeclampsia, postpartum cardiomyopathy and persistent covid. I stressed to the patient that these are life-threatening conditions and that it was extremely important for her to be evaluated ASAP. Discussed that if these issues were unaddressed she could have seizures, stroke and possibly die. Despite this counseling, she was resistant to going to the ER. She  Reported her father could not watch her infant and her husband was at work. She was not willing to call him to come home. I offered to write him a work note or speak with his boss but she declined these offers. Her mother is currently hospitalized for Covid.   Adelene Idler MD, [facility], [facility] 02/09/2020 9:44 AM

## 2020-02-09 NOTE — Telephone Encounter (Signed)
Pt returned phone call.  

## 2020-02-09 NOTE — Telephone Encounter (Signed)
Returned patient's call to the office. She said that yesterday after leaving she was seen by her PCP who gave her lasix and a potassium pill. This helped resolve her swelling. She is feeling better. I encouraged her to still be evaluated in the [facility] MAU for preeclampsia and cardiomyopathy.   Adelene Idler MD, [facility], [facility] 02/09/2020 12:23 PM

## 2020-03-09 ENCOUNTER — Other Ambulatory Visit: Payer: Self-pay

## 2020-03-09 ENCOUNTER — Ambulatory Visit (INDEPENDENT_AMBULATORY_CARE_PROVIDER_SITE_OTHER): Payer: Medicaid Other | Admitting: Obstetrics and Gynecology

## 2020-03-09 ENCOUNTER — Encounter: Payer: Self-pay | Admitting: Obstetrics and Gynecology

## 2020-03-09 DIAGNOSIS — Z3009 Encounter for other general counseling and advice on contraception: Secondary | ICD-10-CM

## 2020-03-09 DIAGNOSIS — Z1332 Encounter for screening for maternal depression: Secondary | ICD-10-CM

## 2020-03-09 NOTE — Progress Notes (Signed)
Postpartum Visit  Chief Complaint:  Chief Complaint  Patient presents with  . Postpartum Care    No concerns    History of Present Illness: Patient is a 35 y.o. G1P1001 presents for postpartum visit. Patient's intrapartum course was complicated by COVID-19 pneumonia and required transfer to [facility]. Patient reports since her delivery she has been seen twice at [facility]. The first admission on 02/12/20 was for endometritis - patient states she stayed 24 hours and completed 6 rounds of IV antibiotics. During this admission patient was noted to have elevated BP, with negative PIH work-up. Patient was started on procardia xl and referred to f/u with PCP - patient has seen PCP with plan to continue BP management there. The second visit on 03/06/20 was for atypical chest pain which spontaneously resolved. Patient otherwise denies concerns at today's visit.  Date of delivery: 02/03/20 Type of delivery: Vaginal delivery - Vacuum or forceps assisted  no Episiotomy No.  Laceration: no  Pregnancy or labor problems:  Yes - admission documentation reviewed Any problems since the delivery:  Yes - subsequent ER visit information reviewed  Newborn Details:  SINGLETON :  1. BabyGender female. Birth weight:  2750g Maternal Details:  Breast or formula feeding: formula feeding Intercourse: No  Contraception after delivery: Yes  - depo prior to discharge during postpartum stay - desires BTL - consent completed today Any bowel or bladder issues: No  Post partum depression/anxiety noted:  no Edinburgh Post-Partum Depression Score: 0 Date of last PAP: 05/14/2017  NIL and HR HPV negative   Review of Systems: ROS  The following portions of the patient's history were reviewed and updated as appropriate: allergies, current medications, past family history, past medical history, past social history, past surgical history and problem list.  Past Medical History:  Past Medical History:   Diagnosis Date  . VP (ventriculoperitoneal) shunt status     Past Surgical History:  Past Surgical History:  Procedure Laterality Date  . DG HYSTEROGRAM (HSG)  10/28/2018      . hydroceph    . SHUNT REVISION VENTRICULAR-PERITONEAL      Family History:  History reviewed. No pertinent family history.  Social History:  Social History   Socioeconomic History  . Marital status: Single    Spouse name: Not on file  . Number of children: Not on file  . Years of education: Not on file  . Highest education level: Not on file  Occupational History  . Not on file  Tobacco Use  . Smoking status: Never Smoker  . Smokeless tobacco: Never Used  Vaping Use  . Vaping Use: Never used  Substance and Sexual Activity  . Alcohol use: Never  . Drug use: No  . Sexual activity: Not Currently    Birth control/protection: Injection  Other Topics Concern  . Not on file  Social History Narrative  . Not on file   Social Determinants of Health   Financial Resource Strain: Not on file  Food Insecurity: Not on file  Transportation Needs: Not on file  Physical Activity: Not on file  Stress: Not on file  Social Connections: Not on file  Intimate Partner Violence: Not on file    Allergies:  No Known Allergies  Medications: Prior to Admission medications   Medication Sig Start Date End Date Taking? Authorizing Provider  dexamethasone (DECADRON) 6 MG tablet Take 6 mg by mouth at bedtime. 02/07/20  Yes [provider]  ferrous sulfate 325 (65  FE) MG tablet Take by mouth. 02/13/20 02/12/21 Yes [provider]  ibuprofen (ADVIL) 600 MG tablet Take 1 tablet (600 mg total) by mouth every 6 (six) hours as needed for moderate pain. 02/05/20  Yes Hermina Staggers, MD  NIFEdipine (PROCARDIA-XL/NIFEDICAL-XL) 30 MG 24 hr tablet Take by mouth. 02/14/20 02/13/21 Yes [provider]  Prenatal Vit-Fe Fumarate-FA (PRENATAL MULTIVITAMIN) TABS tablet Take 2 tablets by mouth daily at 12  noon.   Yes [provider]    Physical Exam Blood pressure 120/60, height 5\' 4"  (1.626 m), weight 163 lb (73.9 kg), not currently breastfeeding.    General: NAD HEENT: normocephalic, anicteric Pulmonary: No increased work of breathing Abdomen: NABS, soft, non-tender, non-distended.  Umbilicus without lesions.  No hepatomegaly, splenomegaly or masses palpable. No evidence of hernia. Genitourinary:  External: Normal external female genitalia.  Normal urethral meatus, normal  Bartholin's and Skene's glands.    Vagina: Normal vaginal mucosa, no evidence of prolapse.    Cervix: Grossly normal in appearance, no bleeding  Uterus: Non-enlarged, mobile, normal contour.  No CMT  Adnexa: ovaries non-enlarged, no adnexal masses  Rectal: deferred Extremities: no edema, erythema, or tenderness Neurologic: Grossly intact Psychiatric: mood appropriate, affect full   Edinburgh Postnatal Depression Scale - 03/09/20 1402      Edinburgh Postnatal Depression Scale:  In the Past 7 Days   I have been able to laugh and see the funny side of things. 0    I have looked forward with enjoyment to things. 0    I have blamed myself unnecessarily when things went wrong. 0    I have been anxious or worried for no good reason. 0    I have felt scared or panicky for no good reason. 0    Things have been getting on top of me. 0    I have been so unhappy that I have had difficulty sleeping. 0    I have felt sad or miserable. 0    I have been so unhappy that I have been crying. 0    The thought of harming myself has occurred to me. 0    Edinburgh Postnatal Depression Scale Total 0           Assessment: 35 y.o. G1P1001 presenting for 6 week postpartum visit  Plan: Problem List Items Addressed This Visit      Other   Postpartum care and examination - Primary    Other Visit Diagnoses    Encounter for screening for maternal depression       Encounter for counseling regarding contraception            1) Contraception - Education given regarding options for contraception, as well as compatibility with breast feeding if applicable.  Patient plans on tubal ligation for contraception. Will RTC for MD visit for BTL. Papers signed today.  2)  Pap - ASCCP guidelines and rational discussed.  ASCCP guidelines and rational discussed.  Patient opts for every 3 years screening interval - Due 04/2020- patient aware.  3) Patient underwent screening for postpartum depression with no signs of depression  4) Return in about 2 weeks (around 03/23/2020) for BTL consult - See MD 03/25/2020).   Jerene Pitch, CNM, MSN [facility], [facility] 03/09/2020, 2:12 PM

## 2020-03-23 ENCOUNTER — Ambulatory Visit: Payer: Medicaid Other | Admitting: Obstetrics and Gynecology

## 2020-05-07 ENCOUNTER — Other Ambulatory Visit: Payer: Self-pay

## 2020-05-07 ENCOUNTER — Other Ambulatory Visit: Payer: Self-pay | Admitting: Obstetrics and Gynecology

## 2020-05-07 ENCOUNTER — Encounter: Payer: Self-pay | Admitting: Obstetrics and Gynecology

## 2020-05-07 ENCOUNTER — Encounter: Payer: Medicaid Other | Admitting: Obstetrics and Gynecology

## 2020-05-07 DIAGNOSIS — Z3042 Encounter for surveillance of injectable contraceptive: Secondary | ICD-10-CM

## 2020-05-07 MED ORDER — MEDROXYPROGESTERONE ACETATE 150 MG/ML IM SUSP: 150.0000 mg | INTRAMUSCULAR | 3 refills | Status: DC

## 2020-05-14 ENCOUNTER — Ambulatory Visit (INDEPENDENT_AMBULATORY_CARE_PROVIDER_SITE_OTHER): Payer: Medicaid Other

## 2020-05-14 ENCOUNTER — Other Ambulatory Visit: Payer: Self-pay

## 2020-05-14 DIAGNOSIS — Z3042 Encounter for surveillance of injectable contraceptive: Secondary | ICD-10-CM

## 2020-05-14 MED ORDER — MEDROXYPROGESTERONE ACETATE 150 MG/ML IM SUSP
150.0000 mg | Freq: Once | INTRAMUSCULAR | Status: AC
  Administered 2020-05-14: 150 mg via INTRAMUSCULAR

## 2020-05-14 MED ORDER — MEDROXYPROGESTERONE ACETATE 150 MG/ML IM SUSP: 150.0000 mg | Freq: Once | INTRAMUSCULAR | Status: DC

## 2020-05-14 NOTE — Progress Notes (Signed)
Pt here for depo; still bleeding from delivery; depo given IM right deltoid.  NDC# [phone]

## 2020-08-06 ENCOUNTER — Ambulatory Visit: Payer: Medicaid Other

## 2020-08-06 NOTE — Telephone Encounter (Signed)
This encounter was created in error - please disregard.

## 2021-02-11 DIAGNOSIS — Z6828 Body mass index (BMI) 28.0-28.9, adult: Secondary | ICD-10-CM | POA: Diagnosis not present

## 2021-02-11 DIAGNOSIS — F411 Generalized anxiety disorder: Secondary | ICD-10-CM | POA: Diagnosis not present

## 2021-02-11 DIAGNOSIS — Z3009 Encounter for other general counseling and advice on contraception: Secondary | ICD-10-CM | POA: Diagnosis not present

## 2021-02-11 DIAGNOSIS — Z3042 Encounter for surveillance of injectable contraceptive: Secondary | ICD-10-CM | POA: Diagnosis not present

## 2021-03-26 DIAGNOSIS — Z3042 Encounter for surveillance of injectable contraceptive: Secondary | ICD-10-CM | POA: Diagnosis not present

## 2021-03-26 DIAGNOSIS — Z6828 Body mass index (BMI) 28.0-28.9, adult: Secondary | ICD-10-CM | POA: Diagnosis not present

## 2021-03-26 DIAGNOSIS — F411 Generalized anxiety disorder: Secondary | ICD-10-CM | POA: Diagnosis not present

## 2021-05-02 DIAGNOSIS — F411 Generalized anxiety disorder: Secondary | ICD-10-CM | POA: Diagnosis not present

## 2021-05-02 DIAGNOSIS — Z309 Encounter for contraceptive management, unspecified: Secondary | ICD-10-CM | POA: Diagnosis not present

## 2021-05-02 DIAGNOSIS — Z3042 Encounter for surveillance of injectable contraceptive: Secondary | ICD-10-CM | POA: Diagnosis not present

## 2021-05-02 DIAGNOSIS — Z3009 Encounter for other general counseling and advice on contraception: Secondary | ICD-10-CM | POA: Diagnosis not present

## 2021-06-27 DIAGNOSIS — R519 Headache, unspecified: Secondary | ICD-10-CM | POA: Diagnosis not present

## 2021-06-27 DIAGNOSIS — Z982 Presence of cerebrospinal fluid drainage device: Secondary | ICD-10-CM | POA: Diagnosis not present

## 2021-07-03 DIAGNOSIS — R590 Localized enlarged lymph nodes: Secondary | ICD-10-CM | POA: Diagnosis not present

## 2021-07-03 DIAGNOSIS — B349 Viral infection, unspecified: Secondary | ICD-10-CM | POA: Diagnosis not present

## 2021-07-03 DIAGNOSIS — R1110 Vomiting, unspecified: Secondary | ICD-10-CM | POA: Diagnosis not present

## 2021-07-03 DIAGNOSIS — Z20822 Contact with and (suspected) exposure to covid-19: Secondary | ICD-10-CM | POA: Diagnosis not present

## 2021-07-03 DIAGNOSIS — R059 Cough, unspecified: Secondary | ICD-10-CM | POA: Diagnosis not present

## 2021-07-03 DIAGNOSIS — R509 Fever, unspecified: Secondary | ICD-10-CM | POA: Diagnosis not present

## 2021-07-23 DIAGNOSIS — Z3009 Encounter for other general counseling and advice on contraception: Secondary | ICD-10-CM | POA: Diagnosis not present

## 2021-07-23 DIAGNOSIS — Z309 Encounter for contraceptive management, unspecified: Secondary | ICD-10-CM | POA: Diagnosis not present

## 2021-09-14 IMAGING — RF DG HYSTEROGRAM
5 series · 5 of 5 positions shown · non-contrast
Comparison: No recent.

CLINICAL DATA: Infertility issues.

EXAM:
HYSTEROSALPINGOGRAM
TECHNIQUE: Following hysterosalpingography by gynecology imaging was obtained.

[Series 1: fluoro_hsg_singleshot_bw · 0.18mm/px · 1 of 1 slices shown (1 of 5)]
[im 1/1]
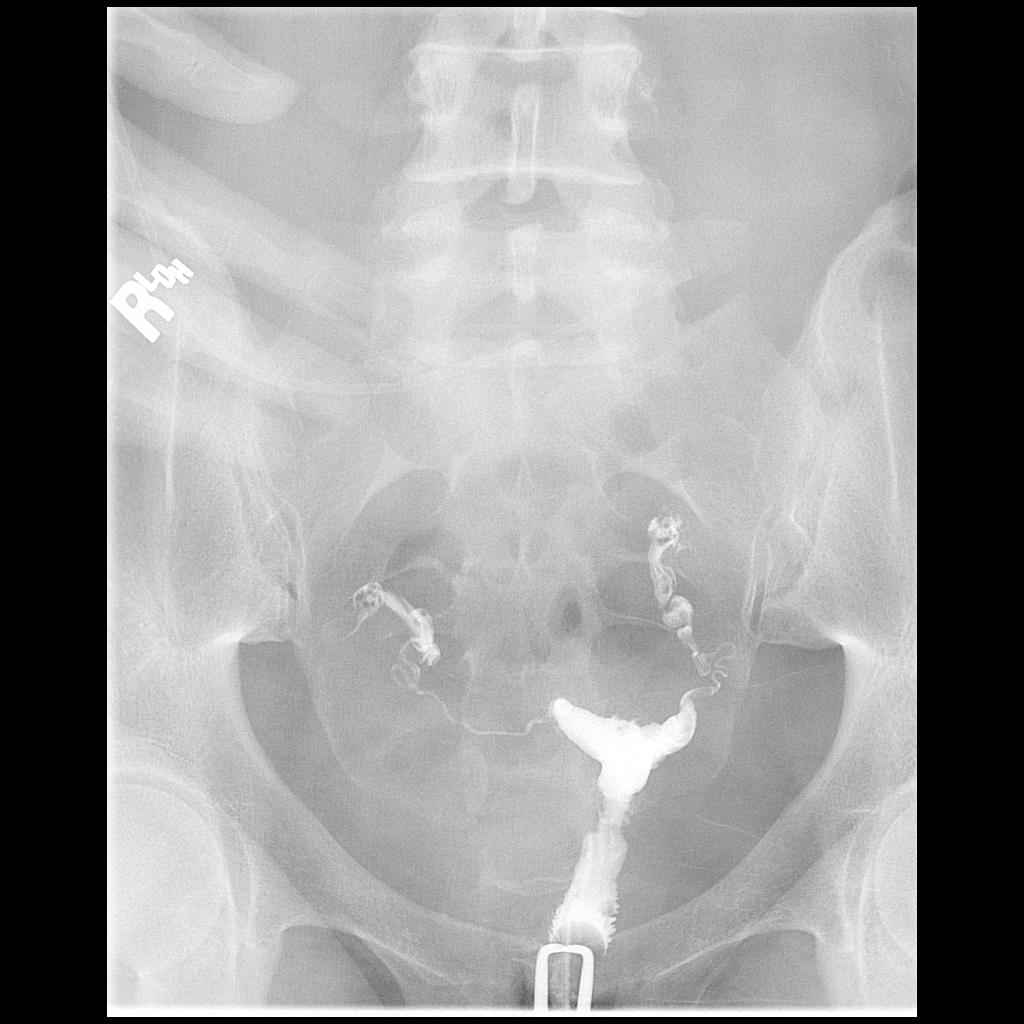

[Series 2: fluoro_hsg_singleshot_bw · 0.18mm/px · 1 of 1 slices shown (2 of 5)]
[im 1/1]
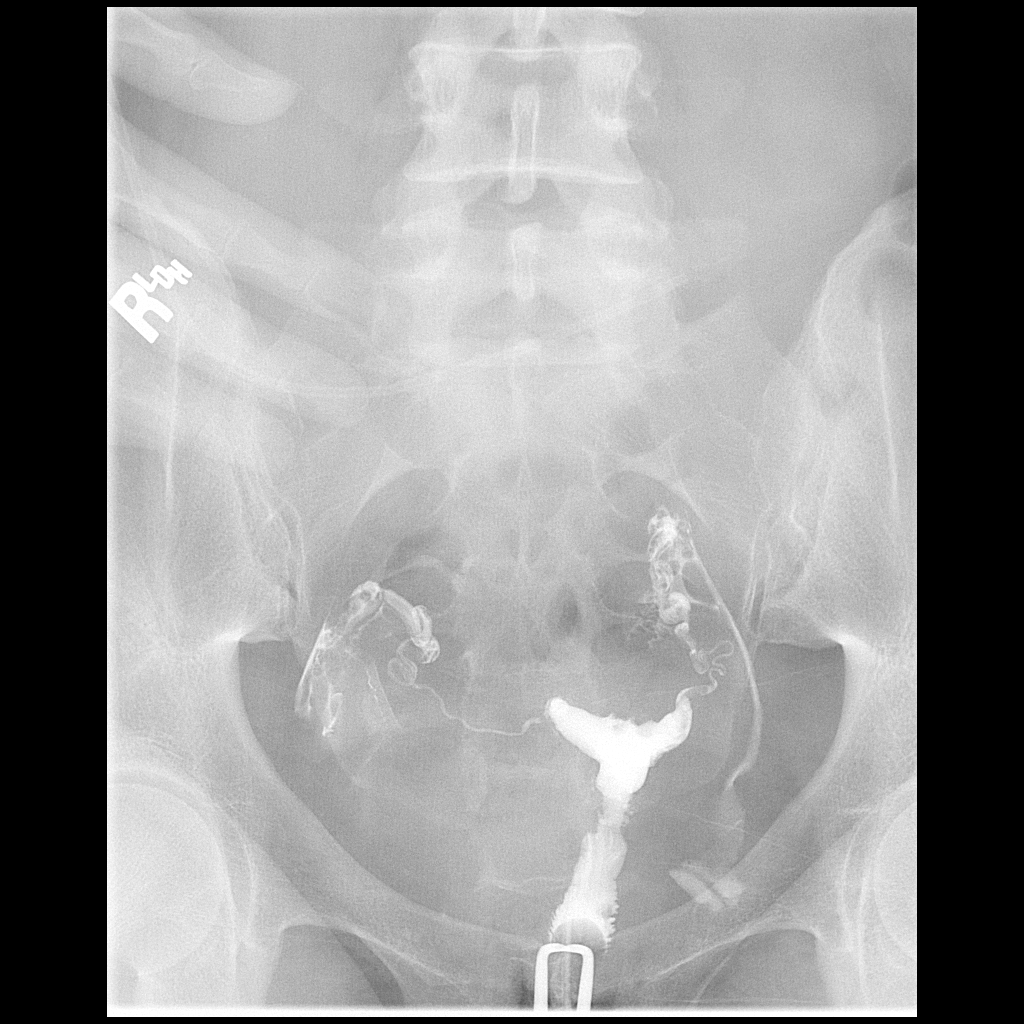

[Series 3: fluoro_hsg_singleshot_bw · 0.18mm/px · 1 of 1 slices shown (3 of 5)]
[im 1/1]
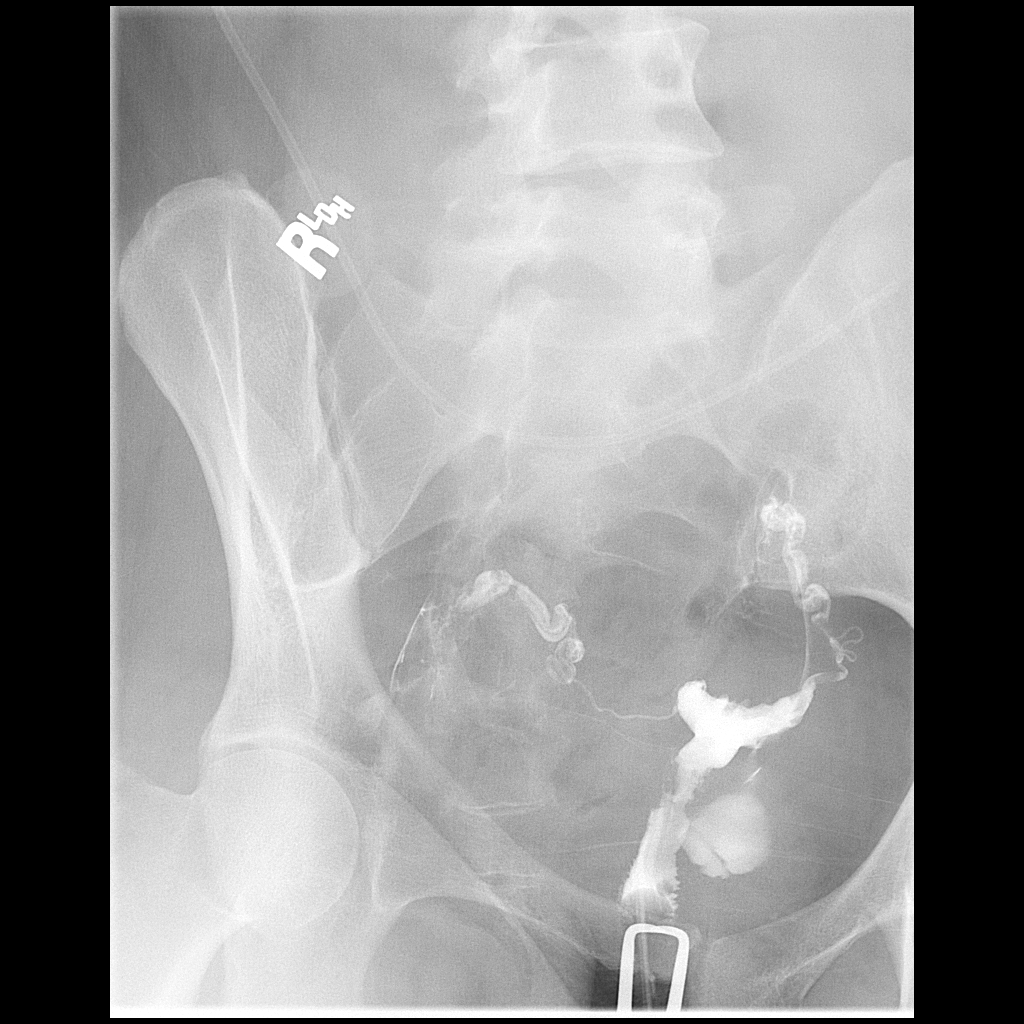

[Series 4: fluoro_hsg_singleshot_bw · 0.18mm/px · 1 of 1 slices shown (4 of 5)]
[im 1/1]
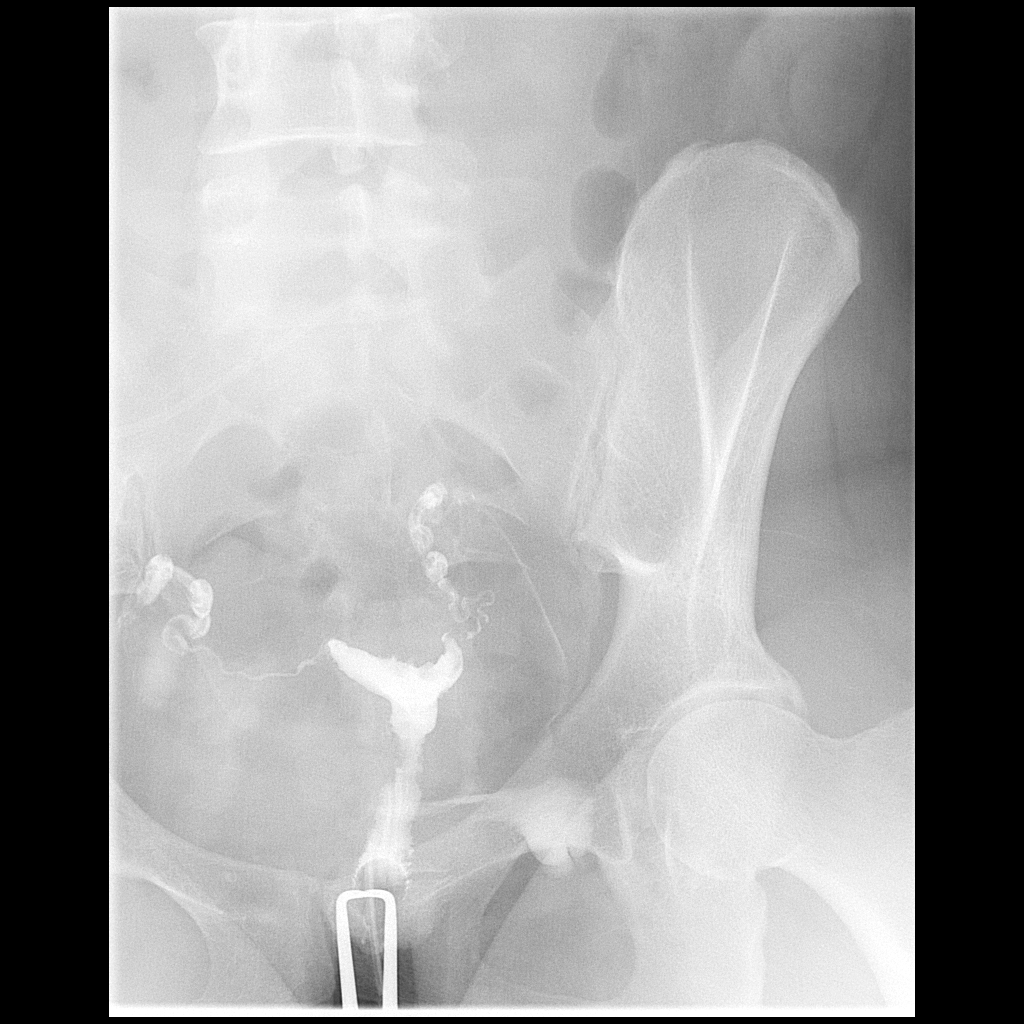

[Series 5: fluoro_hsg_singleshot_bw · 0.18mm/px · 1 of 1 slices shown (5 of 5)]
[im 1/1]
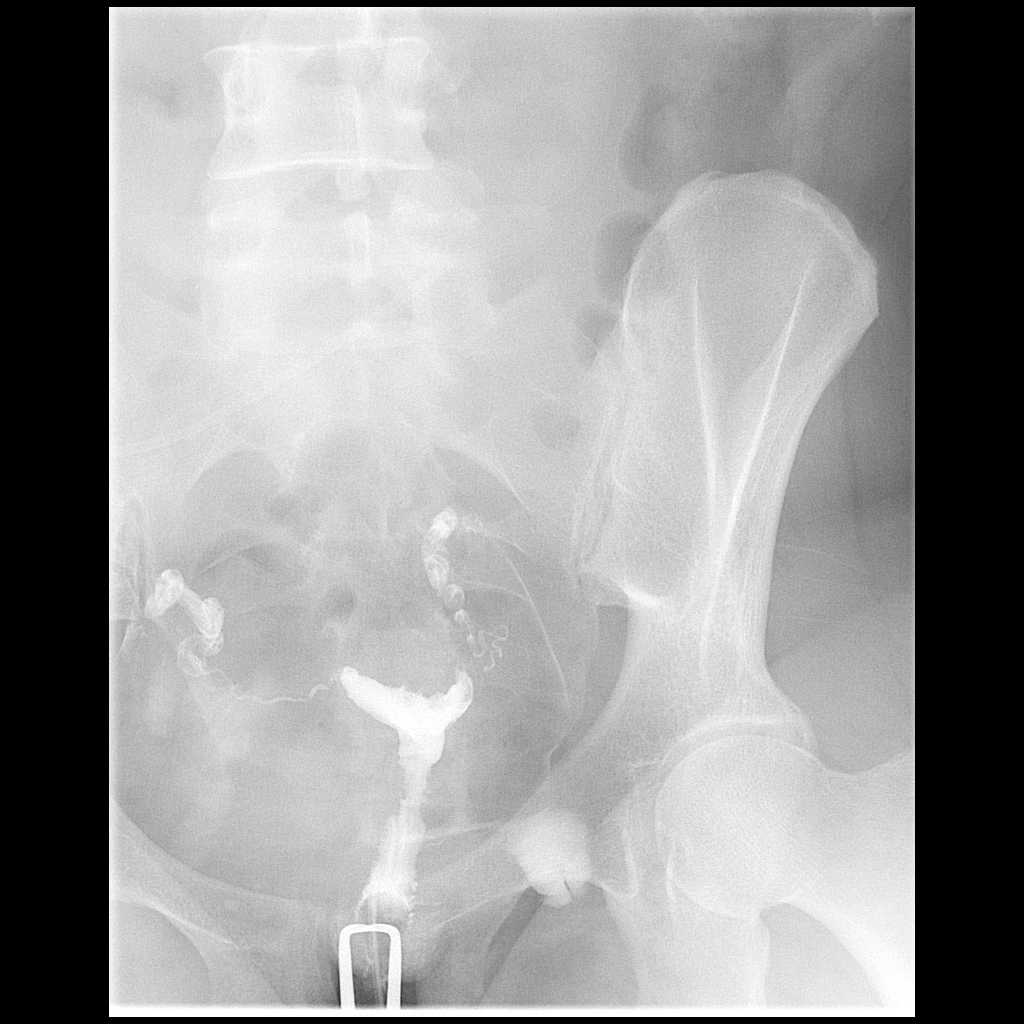

[5 of 5 positions shown; findings below may reference images not displayed]

FLUOROSCOPY TIME:  Radiation Exposure Index (as provided by the
fluoroscopic device): 21.0 mGy

If the device does not provide the exposure index:

Fluoroscopy Time:  1 minutes 24 seconds

Number of Acquired Images:  5
FINDINGS: Uterus is widely patent. No focal mass lesion identified. Fallopian
tubes are widely patent. Bilateral contrast spill noted.
IMPRESSION: No significant abnormality.  Fallopian tubes widely patent.

## 2021-10-16 DIAGNOSIS — Z3009 Encounter for other general counseling and advice on contraception: Secondary | ICD-10-CM | POA: Diagnosis not present

## 2021-10-16 DIAGNOSIS — Z309 Encounter for contraceptive management, unspecified: Secondary | ICD-10-CM | POA: Diagnosis not present

## 2021-10-17 DIAGNOSIS — R519 Headache, unspecified: Secondary | ICD-10-CM | POA: Diagnosis not present

## 2021-12-09 DIAGNOSIS — N3001 Acute cystitis with hematuria: Secondary | ICD-10-CM | POA: Diagnosis not present

## 2021-12-09 DIAGNOSIS — R310 Gross hematuria: Secondary | ICD-10-CM | POA: Diagnosis not present

## 2021-12-09 DIAGNOSIS — R519 Headache, unspecified: Secondary | ICD-10-CM | POA: Diagnosis not present

## 2022-01-01 DIAGNOSIS — Z3009 Encounter for other general counseling and advice on contraception: Secondary | ICD-10-CM | POA: Diagnosis not present

## 2022-01-01 DIAGNOSIS — Z309 Encounter for contraceptive management, unspecified: Secondary | ICD-10-CM | POA: Diagnosis not present

## 2022-01-29 DIAGNOSIS — R112 Nausea with vomiting, unspecified: Secondary | ICD-10-CM | POA: Diagnosis not present

## 2022-02-03 DIAGNOSIS — L309 Dermatitis, unspecified: Secondary | ICD-10-CM | POA: Diagnosis not present

## 2022-03-11 DIAGNOSIS — J069 Acute upper respiratory infection, unspecified: Secondary | ICD-10-CM | POA: Diagnosis not present

## 2022-03-19 DIAGNOSIS — Z309 Encounter for contraceptive management, unspecified: Secondary | ICD-10-CM | POA: Diagnosis not present

## 2022-03-19 DIAGNOSIS — Z3009 Encounter for other general counseling and advice on contraception: Secondary | ICD-10-CM | POA: Diagnosis not present

## 2022-04-25 DIAGNOSIS — R110 Nausea: Secondary | ICD-10-CM | POA: Diagnosis not present

## 2022-04-25 DIAGNOSIS — F419 Anxiety disorder, unspecified: Secondary | ICD-10-CM | POA: Diagnosis not present

## 2022-04-25 DIAGNOSIS — Z982 Presence of cerebrospinal fluid drainage device: Secondary | ICD-10-CM | POA: Diagnosis not present

## 2022-04-25 DIAGNOSIS — T8501XA Breakdown (mechanical) of ventricular intracranial (communicating) shunt, initial encounter: Secondary | ICD-10-CM | POA: Diagnosis not present

## 2022-04-25 DIAGNOSIS — G919 Hydrocephalus, unspecified: Secondary | ICD-10-CM | POA: Diagnosis not present

## 2022-04-25 DIAGNOSIS — R519 Headache, unspecified: Secondary | ICD-10-CM | POA: Diagnosis not present

## 2022-04-25 DIAGNOSIS — Z79899 Other long term (current) drug therapy: Secondary | ICD-10-CM | POA: Diagnosis not present

## 2022-04-28 DIAGNOSIS — Z982 Presence of cerebrospinal fluid drainage device: Secondary | ICD-10-CM | POA: Diagnosis not present

## 2022-04-28 DIAGNOSIS — G43909 Migraine, unspecified, not intractable, without status migrainosus: Secondary | ICD-10-CM | POA: Diagnosis not present

## 2022-05-09 DIAGNOSIS — Z982 Presence of cerebrospinal fluid drainage device: Secondary | ICD-10-CM | POA: Diagnosis not present

## 2022-05-09 DIAGNOSIS — Z1331 Encounter for screening for depression: Secondary | ICD-10-CM | POA: Diagnosis not present

## 2022-05-09 DIAGNOSIS — G43909 Migraine, unspecified, not intractable, without status migrainosus: Secondary | ICD-10-CM | POA: Diagnosis not present

## 2022-05-19 DIAGNOSIS — Z8669 Personal history of other diseases of the nervous system and sense organs: Secondary | ICD-10-CM | POA: Diagnosis not present

## 2022-05-19 DIAGNOSIS — M545 Low back pain, unspecified: Secondary | ICD-10-CM | POA: Diagnosis not present

## 2022-05-19 DIAGNOSIS — R519 Headache, unspecified: Secondary | ICD-10-CM | POA: Diagnosis not present

## 2022-05-19 DIAGNOSIS — R110 Nausea: Secondary | ICD-10-CM | POA: Diagnosis not present

## 2022-06-11 DIAGNOSIS — G43909 Migraine, unspecified, not intractable, without status migrainosus: Secondary | ICD-10-CM | POA: Diagnosis not present

## 2022-06-11 DIAGNOSIS — Z3042 Encounter for surveillance of injectable contraceptive: Secondary | ICD-10-CM | POA: Diagnosis not present

## 2022-06-11 DIAGNOSIS — Z309 Encounter for contraceptive management, unspecified: Secondary | ICD-10-CM | POA: Diagnosis not present

## 2022-06-11 DIAGNOSIS — Z3009 Encounter for other general counseling and advice on contraception: Secondary | ICD-10-CM | POA: Diagnosis not present

## 2022-06-27 DIAGNOSIS — G919 Hydrocephalus, unspecified: Secondary | ICD-10-CM | POA: Diagnosis not present

## 2022-06-27 DIAGNOSIS — Z982 Presence of cerebrospinal fluid drainage device: Secondary | ICD-10-CM | POA: Diagnosis not present

## 2022-06-27 DIAGNOSIS — R519 Headache, unspecified: Secondary | ICD-10-CM | POA: Diagnosis not present

## 2022-08-27 DIAGNOSIS — Z3042 Encounter for surveillance of injectable contraceptive: Secondary | ICD-10-CM | POA: Diagnosis not present

## 2022-08-27 DIAGNOSIS — Z3009 Encounter for other general counseling and advice on contraception: Secondary | ICD-10-CM | POA: Diagnosis not present

## 2022-11-10 DIAGNOSIS — G43909 Migraine, unspecified, not intractable, without status migrainosus: Secondary | ICD-10-CM | POA: Diagnosis not present

## 2022-12-19 DIAGNOSIS — R519 Headache, unspecified: Secondary | ICD-10-CM | POA: Diagnosis not present

## 2022-12-19 DIAGNOSIS — G919 Hydrocephalus, unspecified: Secondary | ICD-10-CM | POA: Diagnosis not present

## 2022-12-19 DIAGNOSIS — Z982 Presence of cerebrospinal fluid drainage device: Secondary | ICD-10-CM | POA: Diagnosis not present

## 2023-01-07 DIAGNOSIS — R110 Nausea: Secondary | ICD-10-CM | POA: Diagnosis not present

## 2023-01-07 DIAGNOSIS — R109 Unspecified abdominal pain: Secondary | ICD-10-CM | POA: Diagnosis not present

## 2023-01-09 DIAGNOSIS — R102 Pelvic and perineal pain: Secondary | ICD-10-CM | POA: Diagnosis not present

## 2023-09-29 DIAGNOSIS — Z982 Presence of cerebrospinal fluid drainage device: Secondary | ICD-10-CM | POA: Diagnosis not present

## 2023-09-29 DIAGNOSIS — R202 Paresthesia of skin: Secondary | ICD-10-CM | POA: Diagnosis not present

## 2023-09-29 DIAGNOSIS — F419 Anxiety disorder, unspecified: Secondary | ICD-10-CM | POA: Diagnosis not present

## 2023-09-29 DIAGNOSIS — R519 Headache, unspecified: Secondary | ICD-10-CM | POA: Diagnosis not present

## 2023-10-02 DIAGNOSIS — G43909 Migraine, unspecified, not intractable, without status migrainosus: Secondary | ICD-10-CM | POA: Diagnosis not present

## 2023-10-02 DIAGNOSIS — Z1331 Encounter for screening for depression: Secondary | ICD-10-CM | POA: Diagnosis not present

## 2023-10-02 DIAGNOSIS — R030 Elevated blood-pressure reading, without diagnosis of hypertension: Secondary | ICD-10-CM | POA: Diagnosis not present

## 2023-10-02 DIAGNOSIS — R202 Paresthesia of skin: Secondary | ICD-10-CM | POA: Diagnosis not present

## 2023-10-23 DIAGNOSIS — Z982 Presence of cerebrospinal fluid drainage device: Secondary | ICD-10-CM | POA: Diagnosis not present

## 2023-10-23 DIAGNOSIS — G43909 Migraine, unspecified, not intractable, without status migrainosus: Secondary | ICD-10-CM | POA: Diagnosis not present

## 2023-10-23 DIAGNOSIS — R030 Elevated blood-pressure reading, without diagnosis of hypertension: Secondary | ICD-10-CM | POA: Diagnosis not present

## 2023-11-19 NOTE — Progress Notes (Unsigned)
 ANNUAL PREVENTATIVE CARE GYNECOLOGY  ENCOUNTER NOTE  Subjective:       Vicki Mcdowell is a 38 y.o. G46P1001 female here for a routine annual gynecologic exam. The patient {is/is not/has never been:13135} sexually active. The patient {is/is not:13135} taking hormone replacement therapy. {post-men bleed:13152::Patient denies post-menopausal vaginal bleeding.} The patient wears seatbelts: {yes/no:311178}. The patient participates in regular exercise: {yes/no/not asked:9010}. Has the patient ever been transfused or tattooed?: {yes/no/not asked:9010}. The patient reports that there {is/is not:9024} domestic violence in her life.  Current complaints: 1.  ***    Gynecologic History No LMP recorded. Patient has had an injection. Contraception: {method:5051} Last Pap: ***. Results were: {norm/abn:16337} Last mammogram: ***. Results were: {norm/abn:16337} Last Colonoscopy:  Last Dexa Scan:    Obstetric History OB History  Gravida Para Term Preterm AB Living  1 1 1  0 0 1  SAB IAB Ectopic Multiple Live Births  0 0 0 0 1    # Outcome Date GA Lbr Len/2nd Weight Sex Type Anes PTL Lv  1 Term 02/03/20 [redacted]w[redacted]d 12:28 / 00:51 6 lb 1 oz (2.75 kg) F Vag-Spont EPI  LIV     Birth Comments: WDL    Past Medical History:  Diagnosis Date  . VP (ventriculoperitoneal) shunt status     No family history on file.  Past Surgical History:  Procedure Laterality Date  . DG HYSTEROGRAM (HSG)  10/28/2018      . hydroceph    . SHUNT REVISION VENTRICULAR-PERITONEAL      Social History   Socioeconomic History  . Marital status: Single    Spouse name: Not on file  . Number of children: Not on file  . Years of education: Not on file  . Highest education level: Not on file  Occupational History  . Not on file  Tobacco Use  . Smoking status: Never  . Smokeless tobacco: Never  Vaping Use  . Vaping status: Never Used  Substance and Sexual Activity  . Alcohol use: Never  . Drug use: No  . Sexual  activity: Not Currently    Birth control/protection: Injection  Other Topics Concern  . Not on file  Social History Narrative  . Not on file   Social Drivers of Health   Financial Resource Strain: Low Risk  (06/27/2022)   Received from [facility]   Overall Financial Resource Strain (CARDIA)   . Difficulty of Paying Living Expenses: Not hard at all  Food Insecurity: No Food Insecurity (06/27/2022)   [facility]   Hunger Vital Sign   . Within the past 12 months, you worried that your food would run out before you got the money to buy more.: Never true   . Within the past 12 months, the food you bought just didn't last and you didn't have money to get more.: Never true  Transportation Needs: No Transportation Needs (06/27/2022)   Received from [facility]   [facility] - Transportation   . In the past 12 months, has lack of transportation kept you from medical appointments or from getting medications?: No   . Lack of Transportation (Non-Medical): No  Physical Activity: Inactive (09/23/2018)   Exercise Vital Sign   . Days of Exercise per Week: 0 days   . Minutes of Exercise per Session: 0 min  Stress: No Stress Concern Present (09/23/2018)   [employer] [facility] [employer] [facility] - Occupational Stress Questionnaire   . Feeling of Stress : Not at  all  Social Connections: Not on file  Intimate Partner Violence: Not on file    Current Outpatient Medications on File Prior to Visit  Medication Sig Dispense Refill  . amoxicillin (AMOXIL) 500 MG capsule SMARTSIG:4 Capsule(s) By Mouth Once    . ibuprofen  (ADVIL ) 600 MG tablet Take 1 tablet (600 mg total) by mouth every 6 (six) hours as needed for moderate pain. 30 tablet 0  . medroxyPROGESTERone  (DEPO-PROVERA ) 150 MG/ML injection Inject 1 mL (150 mg total) into the muscle every 3 (three) months. 1 mL 3  . Prenatal Vit-Fe Fumarate-FA (PRENATAL MULTIVITAMIN) TABS tablet Take 2  tablets by mouth daily at 12 noon.     Current Facility-Administered Medications on File Prior to Visit  Medication Dose Route Frequency Provider Last Rate Last Admin  . medroxyPROGESTERone  (DEPO-PROVERA ) injection 150 mg  150 mg Intramuscular Once Leonce Garnette BIRCH, MD        No Known Allergies    Review of Systems ROS Review of Systems - General ROS: negative for - chills, fatigue, fever, hot flashes, night sweats, weight gain or weight loss Psychological ROS: negative for - anxiety, decreased libido, depression, mood swings, physical abuse or sexual abuse Ophthalmic ROS: negative for - blurry vision, eye pain or loss of vision ENT ROS: negative for - headaches, hearing change, visual changes or vocal changes Allergy and Immunology ROS: negative for - hives, itchy/watery eyes or seasonal allergies Hematological and Lymphatic ROS: negative for - bleeding problems, bruising, swollen lymph nodes or weight loss Endocrine ROS: negative for - galactorrhea, hair pattern changes, hot flashes, malaise/lethargy, mood swings, palpitations, polydipsia/polyuria, skin changes, temperature intolerance or unexpected weight changes Breast ROS: negative for - new or changing breast lumps or nipple discharge Respiratory ROS: negative for - cough or shortness of breath Cardiovascular ROS: negative for - chest pain, irregular heartbeat, palpitations or shortness of breath Gastrointestinal ROS: no abdominal pain, change in bowel habits, or black or bloody stools Genito-Urinary ROS: no dysuria, trouble voiding, or hematuria Musculoskeletal ROS: negative for - joint pain or joint stiffness Neurological ROS: negative for - bowel and bladder control changes Dermatological ROS: negative for rash and skin lesion changes   Objective:   There were no vitals taken for this visit. CONSTITUTIONAL: Well-developed, well-nourished female in no acute distress.  PSYCHIATRIC: Normal mood and affect. Normal behavior.  Normal judgment and thought content. NEUROLGIC: Alert and oriented to person, place, and time. Normal muscle tone coordination. No cranial nerve deficit noted. HENT:  Normocephalic, atraumatic, External right and left ear normal. Oropharynx is clear and moist EYES: Conjunctivae and EOM are normal. Pupils are equal, round, and reactive to light. No scleral icterus.  NECK: Normal range of motion, supple, no masses.  Normal thyroid.  SKIN: Skin is warm and dry. No rash noted. Not diaphoretic. No erythema. No pallor. CARDIOVASCULAR: Normal heart rate noted, regular rhythm, no murmur. RESPIRATORY: Clear to auscultation bilaterally. Effort and breath sounds normal, no problems with respiration noted. BREASTS: Symmetric in size. No masses, skin changes, nipple drainage, or lymphadenopathy. ABDOMEN: Soft, normal bowel sounds, no distention noted.  No tenderness, rebound or guarding.  BLADDER: Normal PELVIC:  Bladder {:311640}  Urethra: {:311719}  Vulva: {:311722}  Vagina: {:311643}  Cervix: {:311644}  Uterus: {:311718}  Adnexa: {:311645}  RV: {Blank multiple:19196::External Exam NormaI,No Rectal Masses,Normal Sphincter tone}  MUSCULOSKELETAL: Normal range of motion. No tenderness.  No cyanosis, clubbing, or edema.  2+ distal pulses. LYMPHATIC: No Axillary, Supraclavicular, or Inguinal Adenopathy.   Labs: Lab Results  Component Value Date   WBC 11.1 (H) 02/05/2020   HGB 10.8 (L) 02/05/2020   HCT 31.1 (L) 02/05/2020   MCV 85.7 02/05/2020   PLT 237 02/05/2020    Lab Results  Component Value Date   CREATININE 0.73 02/05/2020   BUN 10 02/05/2020   NA 134 (L) 02/05/2020   K 3.9 02/05/2020   CL 108 02/05/2020   CO2 17 (L) 02/05/2020    Lab Results  Component Value Date   ALT 39 02/05/2020   AST 22 02/05/2020   ALKPHOS 106 02/05/2020   BILITOT 0.7 02/05/2020    No results found for: CHOL, HDL, LDLCALC, LDLDIRECT, TRIG, CHOLHDL  Lab Results  Component Value  Date   TSH 1.150 12/10/2018    Lab Results  Component Value Date   HGBA1C 5.4 12/10/2018     Assessment:   No diagnosis found.   Plan:  Pap: {Blank multiple:19196::Pap, Reflex if ASCUS,Pap Co Test,GC/CT NAAT,Not needed,Not done} Mammogram: {Blank multiple:19196::***,Ordered,Not Ordered,Not Indicated} Colon Screening:  {Blank multiple:19196::***,Ordered,Not Ordered,Not Indicated} Labs: {Blank multiple:19196::Lipid 1,FBS,TSH,Hemoglobin A1C,Vit D Level***} Routine preventative health maintenance measures emphasized: {Blank multiple:19196::Exercise/Diet/Weight control,Tobacco Warnings,Alcohol/Substance use risks,Stress Management,Peer Pressure Issues,Safe Sex} Flu vaccine status: COVID Vaccination status: Return to Clinic - 1 Year   [employer] [employer]

## 2023-11-19 NOTE — Patient Instructions (Signed)

## 2023-11-20 ENCOUNTER — Other Ambulatory Visit (HOSPITAL_COMMUNITY)
Admission: RE | Admit: 2023-11-20 | Discharge: 2023-11-20 | Disposition: A | Discharge status: Home / Self Care | Source: Ambulatory Visit | Attending: Certified Nurse Midwife | Admitting: Certified Nurse Midwife

## 2023-11-20 ENCOUNTER — Ambulatory Visit (INDEPENDENT_AMBULATORY_CARE_PROVIDER_SITE_OTHER): Admitting: Certified Nurse Midwife

## 2023-11-20 ENCOUNTER — Encounter: Payer: Self-pay | Admitting: Certified Nurse Midwife

## 2023-11-20 VITALS — BP 114/78 | HR 80 | Resp 16 | Ht 64.0 in | Wt 170.1 lb

## 2023-11-20 DIAGNOSIS — Z13220 Encounter for screening for lipoid disorders: Secondary | ICD-10-CM

## 2023-11-20 DIAGNOSIS — Z124 Encounter for screening for malignant neoplasm of cervix: Secondary | ICD-10-CM | POA: Insufficient documentation

## 2023-11-20 DIAGNOSIS — Z01419 Encounter for gynecological examination (general) (routine) without abnormal findings: Secondary | ICD-10-CM | POA: Insufficient documentation

## 2023-11-20 DIAGNOSIS — Z1329 Encounter for screening for other suspected endocrine disorder: Secondary | ICD-10-CM

## 2023-11-20 DIAGNOSIS — Z131 Encounter for screening for diabetes mellitus: Secondary | ICD-10-CM

## 2023-11-22 DIAGNOSIS — N12 Tubulo-interstitial nephritis, not specified as acute or chronic: Secondary | ICD-10-CM | POA: Diagnosis not present

## 2023-11-25 ENCOUNTER — Ambulatory Visit: Payer: Self-pay | Admitting: Certified Nurse Midwife

## 2023-11-25 LAB — CYTOLOGY - PAP
Comment: NEGATIVE
Diagnosis: REACTIVE
High risk HPV: NEGATIVE

## 2023-12-11 DIAGNOSIS — N200 Calculus of kidney: Secondary | ICD-10-CM | POA: Diagnosis not present

## 2023-12-11 DIAGNOSIS — R10A2 Flank pain, left side: Secondary | ICD-10-CM | POA: Diagnosis not present

## 2023-12-11 DIAGNOSIS — D72828 Other elevated white blood cell count: Secondary | ICD-10-CM | POA: Diagnosis not present

## 2023-12-16 DIAGNOSIS — R109 Unspecified abdominal pain: Secondary | ICD-10-CM | POA: Diagnosis not present

## 2023-12-16 DIAGNOSIS — Z9189 Other specified personal risk factors, not elsewhere classified: Secondary | ICD-10-CM | POA: Diagnosis not present

## 2023-12-16 DIAGNOSIS — N390 Urinary tract infection, site not specified: Secondary | ICD-10-CM | POA: Diagnosis not present

## 2023-12-16 DIAGNOSIS — R10A2 Flank pain, left side: Secondary | ICD-10-CM | POA: Diagnosis not present

## 2024-01-05 ENCOUNTER — Telehealth: Payer: Self-pay

## 2024-01-05 NOTE — Telephone Encounter (Signed)
 TRIAGE VOICEMAIL: Patient requesting refill of letrazole

## 2024-01-06 ENCOUNTER — Other Ambulatory Visit: Payer: Self-pay | Admitting: Certified Nurse Midwife

## 2024-01-06 MED ORDER — LETROZOLE 2.5 MG PO TABS: 7.5000 mg | ORAL_TABLET | Freq: Every day | ORAL | 1 refills | Status: AC
